# Patient Record
Sex: Male | Born: 1980 | Race: Black or African American | Hispanic: No | Marital: Single | Smoking: Current every day smoker
Health system: Southern US, Community
[De-identification: ages and names within clinical notes are randomized; demographics above are authoritative.]

## PROBLEM LIST (undated history)

## (undated) DIAGNOSIS — R0600 Dyspnea, unspecified: Secondary | ICD-10-CM

## (undated) DIAGNOSIS — R079 Chest pain, unspecified: Secondary | ICD-10-CM

## (undated) HISTORY — DX: Chest pain, unspecified: R07.9

## (undated) HISTORY — DX: Dyspnea, unspecified: R06.00

---

## 2013-01-26 ENCOUNTER — Emergency Department (HOSPITAL_COMMUNITY): Payer: Self-pay

## 2013-01-26 ENCOUNTER — Emergency Department (HOSPITAL_COMMUNITY)
Admission: EM | Admit: 2013-01-26 | Discharge: 2013-01-26 | Disposition: A | Discharge status: Home / Self Care | Payer: Self-pay | Attending: Emergency Medicine | Admitting: Emergency Medicine

## 2013-01-26 DIAGNOSIS — S0993XA Unspecified injury of face, initial encounter: Secondary | ICD-10-CM | POA: Insufficient documentation

## 2013-01-26 DIAGNOSIS — X503XXA Overexertion from repetitive movements, initial encounter: Secondary | ICD-10-CM | POA: Insufficient documentation

## 2013-01-26 DIAGNOSIS — Y929 Unspecified place or not applicable: Secondary | ICD-10-CM | POA: Insufficient documentation

## 2013-01-26 DIAGNOSIS — Y939 Activity, unspecified: Secondary | ICD-10-CM | POA: Insufficient documentation

## 2013-01-26 DIAGNOSIS — M542 Cervicalgia: Secondary | ICD-10-CM

## 2013-01-26 IMAGING — CR DG CERVICAL SPINE COMPLETE 4+V
6 series · 6 of 6 positions shown · non-contrast
Comparison: None.

CLINICAL DATA: Neck injury; pain with rotation.

CERVICAL SPINE - COMPLETE 4+ VIEW

[w c-spine lat]
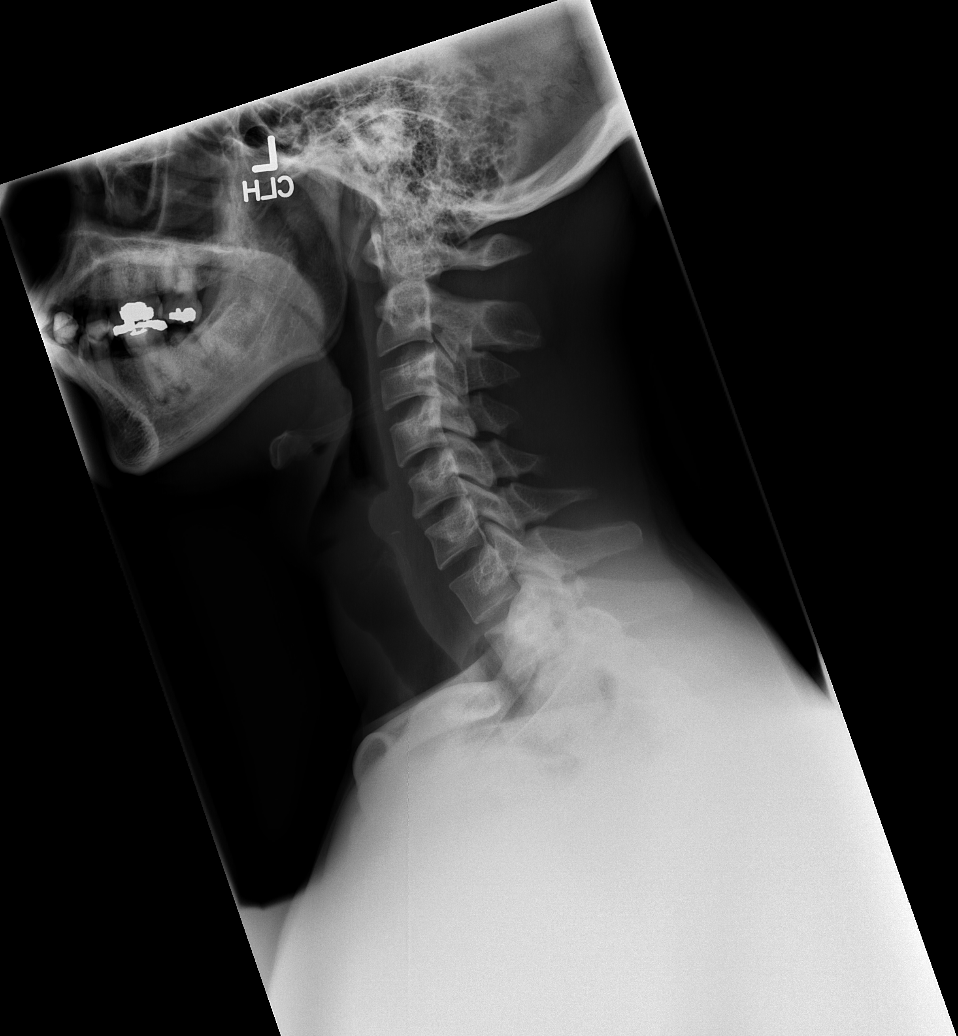

[w c-spine oblique (1 of 3)]
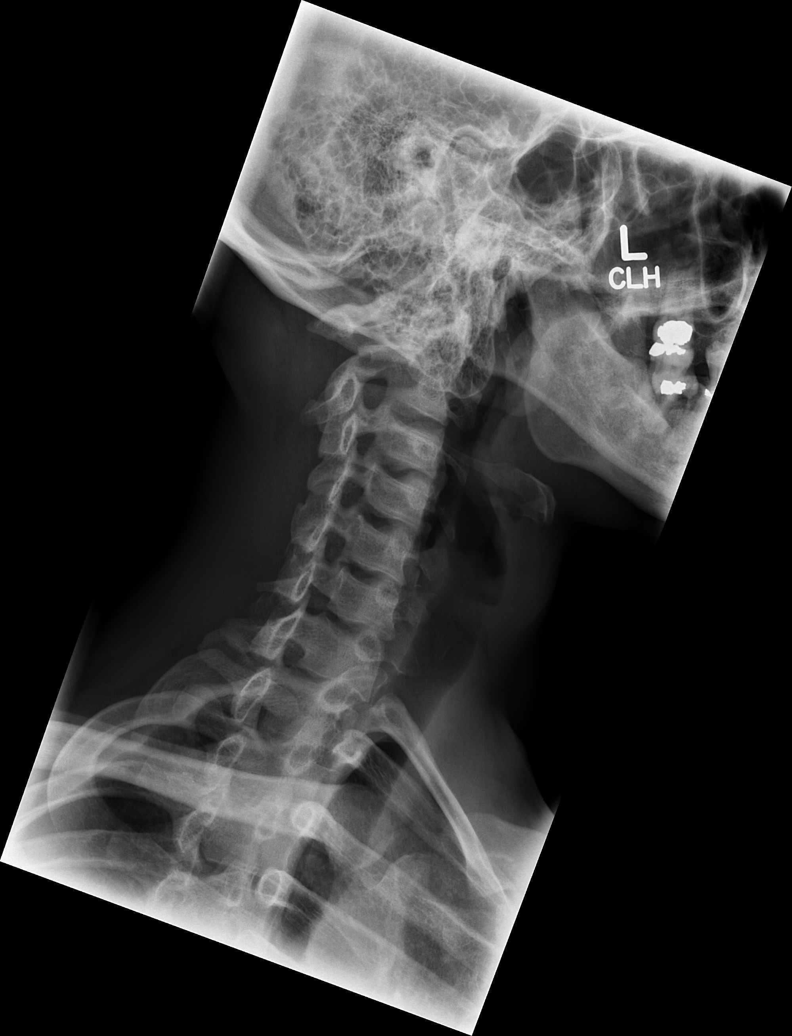

[w c-spine oblique (2 of 3)]
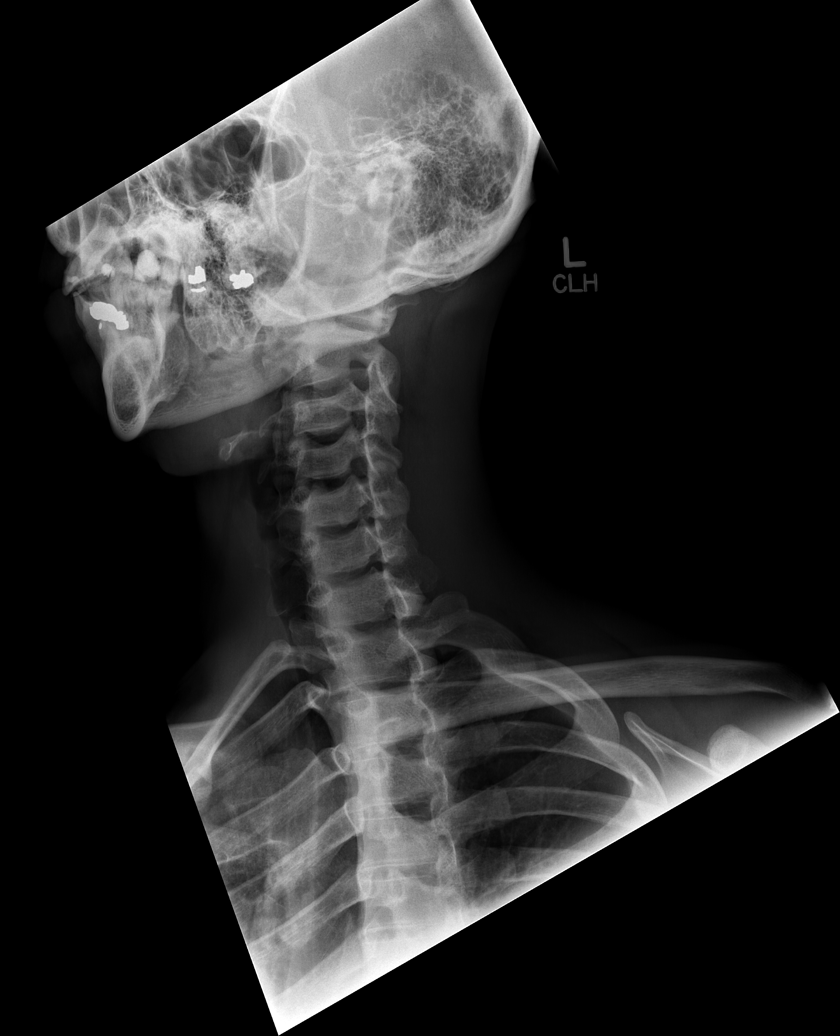

[w c-spine oblique (3 of 3)]
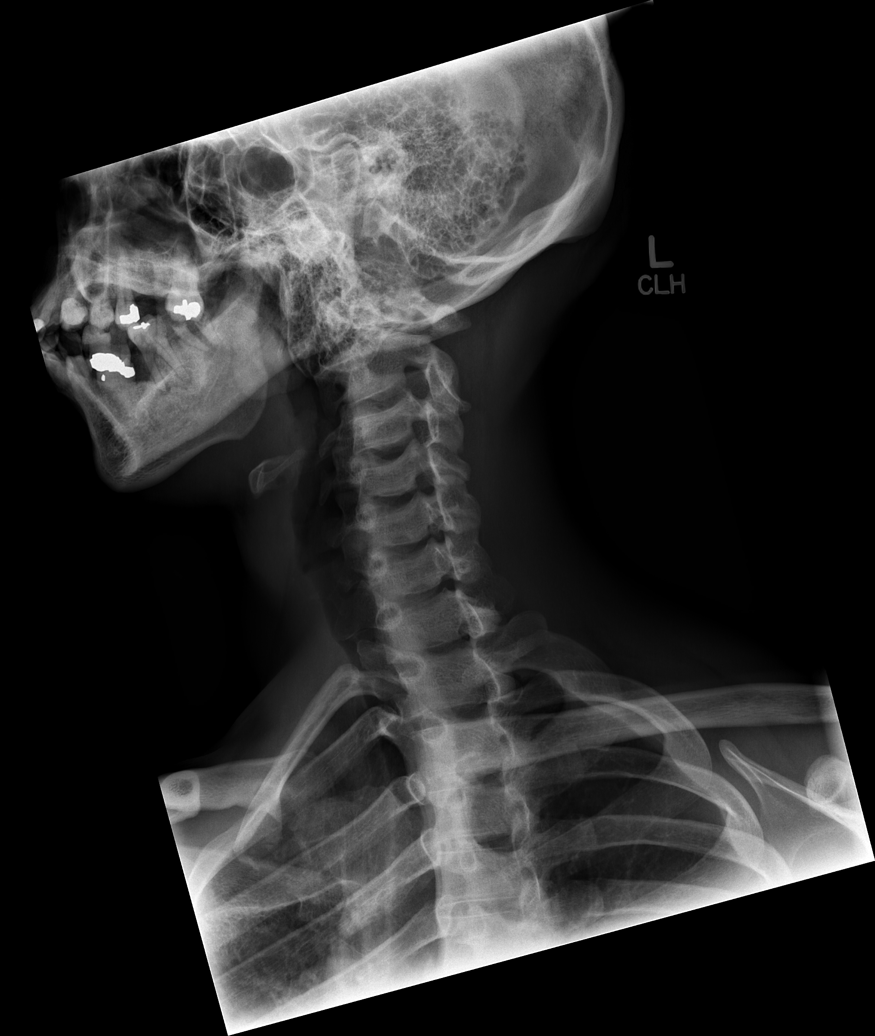

[w c-spine a.p.]
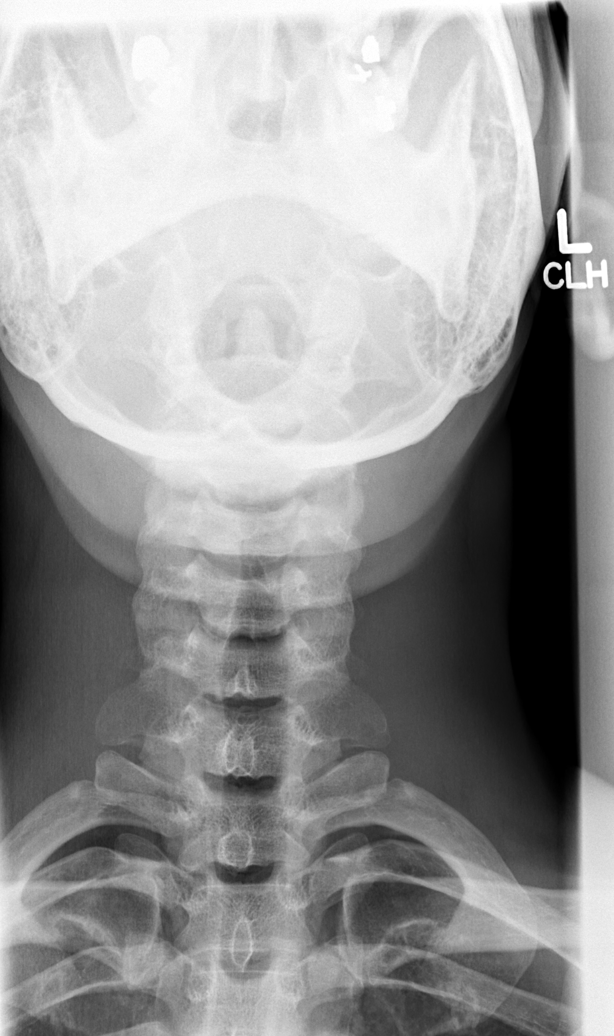

[w c-spine odontoid]
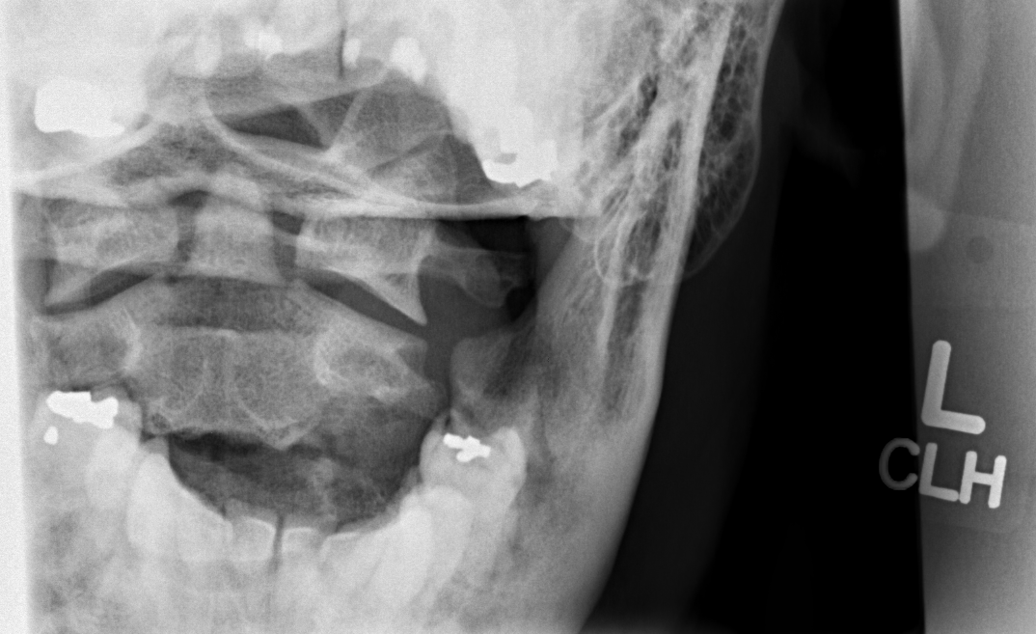

[6 of 6 positions shown; findings below may reference images not displayed]

FINDINGS: There is no evidence of fracture or subluxation.
Vertebral bodies demonstrate normal height and alignment.
Intervertebral disc spaces are preserved.  Prevertebral soft
tissues are within normal limits.  The provided odontoid view
demonstrates no significant abnormality.

The visualized lung apices are clear.
IMPRESSION: No evidence of fracture or subluxation along the cervical spine.

## 2013-01-26 MED ORDER — METHOCARBAMOL 500 MG PO TABS: 500.0000 mg | ORAL_TABLET | Freq: Two times a day (BID) | ORAL | Status: DC

## 2013-01-26 MED ORDER — HYDROCODONE-ACETAMINOPHEN 5-325 MG PO TABS: 1.0000 | ORAL_TABLET | Freq: Four times a day (QID) | ORAL | Status: DC | PRN

## 2013-01-26 MED ORDER — NAPROXEN 500 MG PO TABS: 500.0000 mg | ORAL_TABLET | Freq: Two times a day (BID) | ORAL | Status: DC

## 2013-01-26 NOTE — ED Provider Notes (Signed)
History     CSN: [id number]  Arrival date & time 01/26/13  0254   First MD Initiated Contact with Patient 01/26/13 0301      Chief Complaint  Patient presents with  . Neck Pain    (Consider location/radiation/quality/duration/timing/severity/associated sxs/prior treatment) HPI  SUBJECTIVE:  Ricardo Wright is a 32 y.o. male who complains of an injury causing neck pain 2 day(s) ago. The pain is positional with movement of neck without radiation of pain down the arms. Mechanism of injury: patient was carrying a mattress on his back when the wind blew it causing him to hyperflex the neck rapidly.  Symptoms have been constant and worsening since that time. Prior history of neck problems: no prior neck problems. There is no numbness, tingling, weakness in the arms no weakness or loss of grip strength.   No past medical history on file.  No past surgical history on file.  No family history on file.  History  Substance Use Topics  . Smoking status: Not on file  . Smokeless tobacco: Not on file  . Alcohol Use: Not on file      Review of Systems  HENT: Positive for neck pain. Negative for neck stiffness.   Neurological: Negative for dizziness, weakness, numbness and headaches.    Allergies  Review of patient's allergies indicates no known allergies.  Home Medications  No current outpatient prescriptions on file.  BP 132/88  Pulse 94  Resp 16  SpO2 98%  Physical Exam  Nursing note and vitals reviewed. Constitutional: He appears well-developed and well-nourished. No distress.  HENT:  Head: Normocephalic and atraumatic.  Eyes: Conjunctivae are normal. No scleral icterus.  Neck: Normal range of motion. Neck supple.  Cardiovascular: Normal rate, regular rhythm and normal heart sounds.   Pulmonary/Chest: Effort normal and breath sounds normal. No respiratory distress.  Abdominal: Soft. There is no tenderness.  Musculoskeletal: He exhibits no edema.  Midline cervical  tenderness. Tender to palpation of cervical paraspinal muscles.  ROM limited due to pain.strength 5/5 upper extremities.  Neurological: He is alert.  Skin: Skin is warm and dry. He is not diaphoretic.  Psychiatric: His behavior is normal.    ED Course  Procedures (including critical care time)  Labs Reviewed - No data to display No results found.   1. Neck pain, acute       MDM  Patient with msk pain  Of the neck . wil d/c with nsaid, narcotic, mm relaxer. Will d/c with PCP follow up.  paitent given  reource guide. The patient appears reasonably screened and/or stabilized for discharge and I doubt any other medical condition or other [facility] requiring further screening, evaluation, or treatment in the ED at this time prior to discharge.         Arthor Captain, PA-C 01/28/13 [phone]

## 2013-01-26 NOTE — ED Notes (Signed)
Pt states neck pain after carrying a mattress.

## 2013-01-29 NOTE — ED Provider Notes (Signed)
Medical screening examination/treatment/procedure(s) were performed by non-physician practitioner and as supervising physician I was immediately available for consultation/collaboration.   Richardean Canal, MD 01/29/13 [phone]

## 2014-04-29 ENCOUNTER — Emergency Department (INDEPENDENT_AMBULATORY_CARE_PROVIDER_SITE_OTHER)
Admission: EM | Admit: 2014-04-29 | Discharge: 2014-04-29 | Disposition: A | Discharge status: Home / Self Care | Payer: No Typology Code available for payment source | Source: Home / Self Care | Attending: Emergency Medicine | Admitting: Emergency Medicine

## 2014-04-29 ENCOUNTER — Encounter (HOSPITAL_COMMUNITY): Payer: Self-pay | Admitting: Emergency Medicine

## 2014-04-29 DIAGNOSIS — K029 Dental caries, unspecified: Secondary | ICD-10-CM

## 2014-04-29 MED ORDER — IBUPROFEN 800 MG PO TABS
800.0000 mg | ORAL_TABLET | Freq: Once | ORAL | Status: AC
  Administered 2014-04-29: 800 mg via ORAL

## 2014-04-29 MED ORDER — IBUPROFEN 800 MG PO TABS
ORAL_TABLET | ORAL | Status: AC
  Filled 2014-04-29: qty 1

## 2014-04-29 MED ORDER — PENICILLIN V POTASSIUM 500 MG PO TABS: 500.0000 mg | ORAL_TABLET | Freq: Three times a day (TID) | ORAL | Status: DC

## 2014-04-29 MED ORDER — INDOMETHACIN 25 MG PO CAPS: 25.0000 mg | ORAL_CAPSULE | Freq: Three times a day (TID) | ORAL | Status: DC | PRN

## 2014-04-29 NOTE — Discharge Instructions (Signed)
Dental Care and Dentist Visits Dental care supports good overall health. Regular dental visits can also help you avoid dental pain, bleeding, infection, and other more serious health problems in the future. It is important to keep the mouth healthy because diseases in the teeth, gums, and other oral tissues can spread to other areas of the body. Some problems, such as diabetes, heart disease, and pre-term labor have been associated with poor oral health.  See your dentist every 6 months. If you experience emergency problems such as a toothache or broken tooth, go to the dentist right away. If you see your dentist regularly, you may catch problems early. It is easier to be treated for problems in the early stages.  WHAT TO EXPECT AT A DENTIST VISIT  Your dentist will look for many common oral health problems and recommend proper treatment. At your regular dental visit, you can expect:  Gentle cleaning of the teeth and gums. This includes scraping and polishing. This helps to remove the sticky substance around the teeth and gums (plaque). Plaque forms in the mouth shortly after eating. Over time, plaque hardens on the teeth as tartar. If tartar is not removed regularly, it can cause problems. Cleaning also helps remove stains.  Periodic X-rays. These pictures of the teeth and supporting bone will help your dentist assess the health of your teeth.  Periodic fluoride treatments. Fluoride is a natural mineral shown to help strengthen teeth. Fluoride treatmentinvolves applying a fluoride gel or varnish to the teeth. It is most commonly done in children.  Examination of the mouth, tongue, jaws, teeth, and gums to look for any oral health problems, such as:  Cavities (dental caries). This is decay on the tooth caused by plaque, sugar, and acid in the mouth. It is best to catch a cavity when it is small.  Inflammation of the gums caused by plaque buildup (gingivitis).  Problems with the mouth or malformed  or misaligned teeth.  Oral cancer or other diseases of the soft tissues or jaws. KEEP YOUR TEETH AND GUMS HEALTHY For healthy teeth and gums, follow these general guidelines as well as your dentist's specific advice:  Have your teeth professionally cleaned at the dentist every 6 months.  Brush twice daily with a fluoride toothpaste.  Floss your teeth daily.  Ask your dentist if you need fluoride supplements, treatments, or fluoride toothpaste.  Eat a healthy diet. Reduce foods and drinks with added sugar.  Avoid smoking. TREATMENT FOR ORAL HEALTH PROBLEMS If you have oral health problems, treatment varies depending on the conditions present in your teeth and gums.  Your caregiver will most likely recommend good oral hygiene at each visit.  For cavities, gingivitis, or other oral health disease, your caregiver will perform a procedure to treat the problem. This is typically done at a separate appointment. Sometimes your caregiver will refer you to another dental specialist for specific tooth problems or for surgery. SEEK IMMEDIATE DENTAL CARE IF:  You have pain, bleeding, or soreness in the gum, tooth, jaw, or mouth area.  A permanent tooth becomes loose or separated from the gum socket.  You experience a blow or injury to the mouth or jaw area. Document Released: 06/16/2011 Document Revised: 12/27/2011 Document Reviewed: 06/16/2011 [facility] Patient Information 2015 [place]. This information is not intended to replace advice given to you by your health care provider. Make sure you discuss any questions you have with your health care provider.  Dental Caries  Dental caries (also called tooth  decay) is the most common oral disease. It can occur at any age, but is more common in children and young adults.  HOW DENTAL CARIES DEVELOPS  The process of decay begins when bacteria and foods (particularly sugars and starches) combine in your mouth to produce plaque. Plaque is a  substance that sticks to the hard, outer surface of a tooth (enamel). The bacteria in plaque produce acids that attack enamel. These acids may also attack the root surface of a tooth (cementum) if it is exposed. Repeated attacks dissolve these surfaces and create holes in the tooth (cavities). If left untreated, the acids destroy the other layers of the tooth.  RISK FACTORS  Frequent sipping of sugary beverages.   Frequent snacking on sugary and starchy foods, especially those that easily get stuck in the teeth.   Poor oral hygiene.   Dry mouth.   Substance abuse such as methamphetamine abuse.   Broken or poor-fitting dental restorations.   Eating disorders.   Gastroesophageal reflux disease (GERD).   Certain radiation treatments to the head and neck. SYMPTOMS In the early stages of dental caries, symptoms are seldom present. Sometimes white, chalky areas may be seen on the enamel or other tooth layers. In later stages, symptoms may include:  Pits and holes on the enamel.  Toothache after sweet, hot, or cold foods or drinks are consumed.  Pain around the tooth.  Swelling around the tooth. DIAGNOSIS  Most of the time, dental caries is detected during a regular dental checkup. A diagnosis is made after a thorough medical and dental history is taken and the surfaces of your teeth are checked for signs of dental caries. Sometimes special instruments, such as lasers, are used to check for dental caries. Dental X-ray exams may be taken so that areas not visible to the eye (such as between the contact areas of the teeth) can be checked for cavities.  TREATMENT  If dental caries is in its early stages, it may be reversed with a fluoride treatment or an application of a remineralizing agent at the dental office. Thorough brushing and flossing at home is needed to aid these treatments. If it is in its later stages, treatment depends on the location and extent of tooth destruction:    If a small area of the tooth has been destroyed, the destroyed area will be removed and cavities will be filled with a material such as gold, silver amalgam, or composite resin.   If a large area of the tooth has been destroyed, the destroyed area will be removed and a cap (crown) will be fitted over the remaining tooth structure.   If the center part of the tooth (pulp) is affected, a procedure called a root canal will be needed before a filling or crown can be placed.   If most of the tooth has been destroyed, the tooth may need to be pulled (extracted). HOME CARE INSTRUCTIONS You can prevent, stop, or reverse dental caries at home by practicing good oral hygiene. Good oral hygiene includes:  Thoroughly cleaning your teeth at least twice a day with a toothbrush and dental floss.   Using a fluoride toothpaste. A fluoride mouth rinse may also be used if recommended by your dentist or health care provider.   Restricting the amount of sugary and starchy foods and sugary liquids you consume.   Avoiding frequent snacking on these foods and sipping of these liquids.   Keeping regular visits with a dentist for checkups and cleanings. PREVENTION  Practice good oral hygiene.  Consider a dental sealant. A dental sealant is a coating material that is applied by your dentist to the pits and grooves of teeth. The sealant prevents food from being trapped in them. It may protect the teeth for several years.  Ask about fluoride supplements if you live in a community without fluorinated water or with water that has a low fluoride content. Use fluoride supplements as directed by your dentist or health care provider.  Allow fluoride varnish applications to teeth if directed by your dentist or health care provider. Document Released: 06/26/2002 Document Revised: 06/06/2013 Document Reviewed: 10/06/2012 [facility] Patient Information 2015 [place], [place]. This information is not intended to  replace advice given to you by your health care provider. Make sure you discuss any questions you have with your health care provider.  Dental Pain A tooth ache may be caused by cavities (tooth decay). Cavities expose the nerve of the tooth to air and hot or cold temperatures. It may come from an infection or abscess (also called a boil or furuncle) around your tooth. It is also often caused by dental caries (tooth decay). This causes the pain you are having. DIAGNOSIS  Your caregiver can diagnose this problem by exam. TREATMENT   If caused by an infection, it may be treated with medications which kill germs (antibiotics) and pain medications as prescribed by your caregiver. Take medications as directed.  Only take over-the-counter or prescription medicines for pain, discomfort, or fever as directed by your caregiver.  Whether the tooth ache today is caused by infection or dental disease, you should see your dentist as soon as possible for further care. SEEK MEDICAL CARE IF: The exam and treatment you received today has been provided on an emergency basis only. This is not a substitute for complete medical or dental care. If your problem worsens or new problems (symptoms) appear, and you are unable to meet with your dentist, call or return to this location. SEEK IMMEDIATE MEDICAL CARE IF:   You have a fever.  You develop redness and swelling of your face, jaw, or neck.  You are unable to open your mouth.  You have severe pain uncontrolled by pain medicine. MAKE SURE YOU:   Understand these instructions.  Will watch your condition.  Will get help right away if you are not doing well or get worse. Document Released: 10/04/2005 Document Revised: 12/27/2011 Document Reviewed: 05/22/2008 [facility] Patient Information 2015 [place], [place]. This information is not intended to replace advice given to you by your health care provider. Make sure you discuss any questions you have with your  health care provider.

## 2014-04-29 NOTE — ED Provider Notes (Signed)
CSN: [id number]     Arrival date & time 04/29/14  1254 History   First MD Initiated Contact with Patient 04/29/14 1316     Chief Complaint  Patient presents with  . Dental Pain   (Consider location/radiation/quality/duration/timing/severity/associated sxs/prior Treatment) Patient is a 33 y.o. male presenting with tooth pain. The history is provided by the patient.  Dental Pain Location:  Lower Lower teeth location:  19/LL 1st molar, 18/LL 2nd molar and 17/LL 3rd molar Quality:  Aching Severity:  Mild Onset quality:  Gradual Duration:  4 days Timing:  Constant Progression:  Unchanged Chronicity:  Recurrent Risk factors: lack of dental care and smoking     History reviewed. No pertinent past medical history. History reviewed. No pertinent past surgical history. No family history on file. History  Substance Use Topics  . Smoking status: Current Every Day Smoker -- 1.00 packs/day    Types: Cigarettes  . Smokeless tobacco: Not on file  . Alcohol Use: Yes    Review of Systems  All other systems reviewed and are negative.   Allergies  Review of patient's allergies indicates no known allergies.  Home Medications   Prior to Admission medications   Medication Sig Start Date End Date Taking? Authorizing Provider  HYDROcodone-acetaminophen (NORCO) 5-325 MG per tablet Take 1 tablet by mouth every 6 (six) hours as needed for pain. 01/26/13   Arthor CaptainAbigail Harris, PA-C  indomethacin (INDOCIN) 25 MG capsule Take 1 capsule (25 mg total) by mouth 3 (three) times daily as needed for mild pain or moderate pain. 04/29/14   Ardis RowanJennifer Lee Konnie Noffsinger, PA  methocarbamol (ROBAXIN) 500 MG tablet Take 1 tablet (500 mg total) by mouth 2 (two) times daily. 01/26/13   Arthor CaptainAbigail Harris, PA-C  naproxen (NAPROSYN) 500 MG tablet Take 1 tablet (500 mg total) by mouth 2 (two) times daily with a meal. 01/26/13   Arthor CaptainAbigail Harris, PA-C  penicillin v potassium (VEETID) 500 MG tablet Take 1 tablet (500 mg total) by mouth 3  (three) times daily. X 7 day 04/29/14   Jess BartersJennifer Lee Soledad Budreau, PA   BP 130/95  Pulse 90  Temp(Src) 99.5 F (37.5 C) (Oral)  Resp 14  SpO2 100% Physical Exam  Nursing note and vitals reviewed. Constitutional: He is oriented to person, place, and time. He appears well-developed and well-nourished. No distress.  BP rechecked manually and found to be 130/95.   HENT:  Head: Normocephalic and atraumatic.  Right Ear: External ear normal. No mastoid tenderness.  Left Ear: Hearing normal. No mastoid tenderness.  Nose: Nose normal.  Mouth/Throat: Uvula is midline, oropharynx is clear and moist and mucous membranes are normal. No oral lesions. No trismus in the jaw. Dental caries present. No uvula swelling.    Widespread dental decay with localized tenderness at left lower molars  Eyes: Conjunctivae are normal. No scleral icterus.  Neck: Normal range of motion. Neck supple.  Cardiovascular: Normal rate.   Pulmonary/Chest: Effort normal.  Lymphadenopathy:    He has no cervical adenopathy.  Neurological: He is alert and oriented to person, place, and time.  Skin: Skin is warm and dry. No rash noted. No erythema.  Psychiatric: He has a normal mood and affect. His behavior is normal.    ED Course  Procedures (including critical care time) Labs Review Labs Reviewed - No data to display  Imaging Review No results found.   MDM   1. Dental cavities    Provided patient with printed information for 6 local low cost dental clinics  along with upcoming [facility] free dental clinic Aug. 28- 29, 2015. Also rechecked BP manually and found it to be 130/95.    Jess Barters [place], [place] 04/29/14 [phone]

## 2014-04-29 NOTE — ED Notes (Signed)
Pt c/o left bottom side dental pain x5 days Has not dentist Trying OTC oral gel w/no relief Alert w/no signs of acute distress.

## 2014-04-30 NOTE — ED Provider Notes (Signed)
Medical screening examination/treatment/procedure(s) were performed by non-physician practitioner and as supervising physician I was immediately available for consultation/collaboration.  Leslee Homeavid Thessaly Mccullers, M.D.  Reuben Likesavid C Ancelmo Hunt, MD 04/30/14 [phone]

## 2018-05-01 ENCOUNTER — Encounter (HOSPITAL_COMMUNITY): Payer: Self-pay | Admitting: Emergency Medicine

## 2018-05-01 ENCOUNTER — Emergency Department (HOSPITAL_COMMUNITY)
Admission: EM | Admit: 2018-05-01 | Discharge: 2018-05-02 | Disposition: A | Discharge status: Home / Self Care | Payer: No Typology Code available for payment source | Attending: Emergency Medicine | Admitting: Emergency Medicine

## 2018-05-01 DIAGNOSIS — Y9389 Activity, other specified: Secondary | ICD-10-CM | POA: Insufficient documentation

## 2018-05-01 DIAGNOSIS — S61215A Laceration without foreign body of left ring finger without damage to nail, initial encounter: Secondary | ICD-10-CM | POA: Insufficient documentation

## 2018-05-01 DIAGNOSIS — F17210 Nicotine dependence, cigarettes, uncomplicated: Secondary | ICD-10-CM | POA: Insufficient documentation

## 2018-05-01 DIAGNOSIS — Z23 Encounter for immunization: Secondary | ICD-10-CM | POA: Insufficient documentation

## 2018-05-01 DIAGNOSIS — Y999 Unspecified external cause status: Secondary | ICD-10-CM | POA: Insufficient documentation

## 2018-05-01 DIAGNOSIS — W228XXA Striking against or struck by other objects, initial encounter: Secondary | ICD-10-CM | POA: Insufficient documentation

## 2018-05-01 DIAGNOSIS — Y929 Unspecified place or not applicable: Secondary | ICD-10-CM | POA: Insufficient documentation

## 2018-05-01 MED ORDER — LIDOCAINE-EPINEPHRINE (PF) 2 %-1:200000 IJ SOLN
20.0000 mL | Freq: Once | INTRAMUSCULAR | Status: AC
  Administered 2018-05-01: 20 mL
  Filled 2018-05-01: qty 20

## 2018-05-01 MED ORDER — TETANUS-DIPHTH-ACELL PERTUSSIS 5-2.5-18.5 LF-MCG/0.5 IM SUSP
0.5000 mL | Freq: Once | INTRAMUSCULAR | Status: AC
  Administered 2018-05-01: 0.5 mL via INTRAMUSCULAR
  Filled 2018-05-01: qty 0.5

## 2018-05-01 MED ORDER — CEPHALEXIN 500 MG PO CAPS: 500.0000 mg | ORAL_CAPSULE | Freq: Two times a day (BID) | ORAL | 0 refills | Status: AC

## 2018-05-01 NOTE — ED Triage Notes (Signed)
Pt cut finger on cabinet, pt does not know when last tetanus shot was. Denies pain, bleeding controlled

## 2018-05-01 NOTE — Discharge Instructions (Addendum)
1. Medications: Tylenol or ibuprofen for pain, antibiotics 2. Treatment: ice for swelling, keep wound clean with warm soap and water and keep bandage dry 3. Follow Up: Please return in 10 days to have your stitches removed or sooner if you have concerns. Return to the emergency department for increased redness, drainage of pus from the wound   WOUND CARE  Remove bandage and wash wound gently with mild soap and warm water daily. Reapply a new bandage after cleaning wound  Do not apply any ointments or creams to the wound while stitches are in place, as this may cause delayed healing. Return if you experience any of the following signs of infection: Swelling, redness, pus drainage, streaking, fever >101.0 F  Return if you experience excessive bleeding that does not stop after 15-20 minutes of constant, firm pressure.

## 2018-05-02 NOTE — ED Provider Notes (Signed)
[facility] EMERGENCY DEPARTMENT Provider Note   CSN: [id number] Arrival date & time: 05/01/18  2135     History   Chief Complaint Chief Complaint  Patient presents with  . Extremity Laceration    HPI Ricardo Wright is a 37 y.o. male presenting for evaluation of finger laceration.  Patient states he was moving a filing cabinet when he cut the side of his left ring finger.  This happened around noon today.  He wrapped it up with some cloth, but when he went to unwrap it several hours later, he noticed that he was still having some bleeding and noticed the meat of the fingers seem to be hanging out.  He then came to the emergency room.  He does not know when his last tetanus shot was, things was more than 10 years ago.  He is not on blood thinners.  He has no other medical problems, does not take medications daily.  He has not taken anything for pain.  He states his finger does not hurt very much.  He denies numbness or tingling.  He denies injury elsewhere.  HPI  History reviewed. No pertinent past medical history.  There are no active problems to display for this patient.   History reviewed. No pertinent surgical history.      Home Medications    Prior to Admission medications   Medication Sig Start Date End Date Taking? Authorizing Provider  cephALEXin (KEFLEX) 500 MG capsule Take 1 capsule (500 mg total) by mouth 2 (two) times daily for 5 days. 05/01/18 05/06/18  Steen Bisig, PA-C  HYDROcodone-acetaminophen (NORCO) 5-325 MG per tablet Take 1 tablet by mouth every 6 (six) hours as needed for pain. 01/26/13   Arthor Captain, PA-C  indomethacin (INDOCIN) 25 MG capsule Take 1 capsule (25 mg total) by mouth 3 (three) times daily as needed for mild pain or moderate pain. 04/29/14   Presson, Mathis Fare, PA  methocarbamol (ROBAXIN) 500 MG tablet Take 1 tablet (500 mg total) by mouth 2 (two) times daily. 01/26/13   Arthor Captain, PA-C  naproxen (NAPROSYN)  500 MG tablet Take 1 tablet (500 mg total) by mouth 2 (two) times daily with a meal. 01/26/13   Arthor Captain, PA-C  penicillin v potassium (VEETID) 500 MG tablet Take 1 tablet (500 mg total) by mouth 3 (three) times daily. X 7 day 04/29/14   Presson, Mathis Fare, PA    Family History No family history on file.  Social History Social History   Tobacco Use  . Smoking status: Current Every Day Smoker    Packs/day: 1.00    Types: Cigarettes  . Smokeless tobacco: Never Used  Substance Use Topics  . Alcohol use: Yes  . Drug use: Yes    Types: Marijuana     Allergies   Patient has no known allergies.   Review of Systems Review of Systems  Skin: Positive for wound.  Neurological: Negative for numbness.  Hematological: Does not bruise/bleed easily.     Physical Exam Updated Vital Signs BP 124/78   Pulse 87   Temp 98.6 F (37 C) (Oral)   Ht 6\' 2"  (1.88 m)   Wt 104.3 kg (230 lb)   SpO2 94%   BMI 29.53 kg/m   Physical Exam  Constitutional: He is oriented to person, place, and time. He appears well-developed and well-nourished. No distress.  HENT:  Head: Normocephalic and atraumatic.  Eyes: EOM are normal.  Neck: Normal range of motion.  Pulmonary/Chest: Effort normal.  Abdominal: He exhibits no distension.  Musculoskeletal: Normal range of motion. He exhibits no edema or tenderness.  Full active range of motion of the ring finger without difficulty.  Sensation intact.  Cap refill intact.  Strength against resistance intact.  Neurological: He is alert and oriented to person, place, and time. No sensory deficit.  Skin: Skin is warm. Capillary refill takes less than 2 seconds. No rash noted.  Laceration of the left ring finger without active bleeding.  Laceration is V-shaped overlying the ulnar aspect of the PIP.  Depth approximately 1 to 2 mm.  Length approximately 1 cm.  No injury noted elsewhere.  Psychiatric: He has a normal mood and affect.  Nursing note and  vitals reviewed.    ED Treatments / Results  Labs (all labs ordered are listed, but only abnormal results are displayed) Labs Reviewed - No data to display  EKG None  Radiology No results found.  Procedures .Marland Kitchen.Laceration Repair Date/Time: 05/02/2018 12:33 AM Performed by: Alveria Apleyaccavale, Algie Cales, PA-C Authorized by: Alveria Apleyaccavale, Chavela Justiniano, PA-C   Consent:    Consent obtained:  Verbal   Consent given by:  Patient   Risks discussed:  Pain and infection Anesthesia (see MAR for exact dosages):    Anesthesia method:  Nerve block   Block location:  Left ring finger digital block   Block needle gauge:  25 G   Block anesthetic:  Lidocaine 2% WITH epi   Block injection procedure:  Anatomic landmarks identified, introduced needle, negative aspiration for blood and incremental injection   Block outcome:  Anesthesia achieved Laceration details:    Location:  Finger   Finger location:  L ring finger   Length (cm):  1   Depth (mm):  2 Repair type:    Repair type:  Simple Pre-procedure details:    Preparation:  Patient was prepped and draped in usual sterile fashion Exploration:    Wound exploration: wound explored through full range of motion and entire depth of wound probed and visualized     Wound extent: no foreign bodies/material noted, no muscle damage noted, no nerve damage noted, no tendon damage noted and no vascular damage noted   Treatment:    Area cleansed with:  Saline and Betadine   Amount of cleaning:  Extensive   Irrigation method:  Syringe Skin repair:    Repair method:  Sutures   Suture size:  4-0   Suture material:  Prolene   Suture technique:  Simple interrupted   Number of sutures:  3 Approximation:    Approximation:  Close Post-procedure details:    Dressing:  Bulky dressing   Patient tolerance of procedure:  Tolerated well, no immediate complications   (including critical care time)  Medications Ordered in ED Medications  lidocaine-EPINEPHrine (XYLOCAINE  W/EPI) 2 %-1:200000 (PF) injection 20 mL (20 mLs Infiltration Given 05/01/18 2256)  Tdap (BOOSTRIX) injection 0.5 mL (0.5 mLs Intramuscular Given 05/01/18 2359)     Initial Impression / Assessment and Plan / ED Course  I have reviewed the triage vital signs and the nursing notes.  Pertinent labs & imaging results that were available during my care of the patient were reviewed by me and considered in my medical decision making (see chart for details).     Patient presenting for evaluation of finger laceration.  Physical exam reassuring, patient is neurovascularly intact.  It is not very deep, doubt underlying fracture.  However, laceration is overlying the joint.  Laceration has been present for  approximately 9 hours, will soak in Betadine and extensively cleaned prior to suturing.  Laceration repaired and aftercare instructions given.  Will start patient on Keflex for prophylaxis due to prolonged exposure time.  Bulky dressing to help prevent bending.  Strict return precautions for signs of infection.  Tetanus updated.  At this time, patient proceed for discharge.  Return precautions given.  Patient states he understands and agrees to plan.  Final Clinical Impressions(s) / ED Diagnoses   Final diagnoses:  Laceration of left ring finger without foreign body without damage to nail, initial encounter    ED Discharge Orders        Ordered    cephALEXin (KEFLEX) 500 MG capsule  2 times daily     05/01/18 2351       Maija Biggers, PA-C 05/02/18 0038    Linwood Dibbles, MD 05/02/18 1549

## 2019-03-22 IMAGING — CR DG SHOULDER 2+V*R*
3 series · 3 of 3 positions shown · non-contrast
Comparison: None.

CLINICAL DATA: Posttraumatic shoulder pain.

EXAM:
RIGHT SHOULDER - 2+ VIEW

[w shoulder external right]
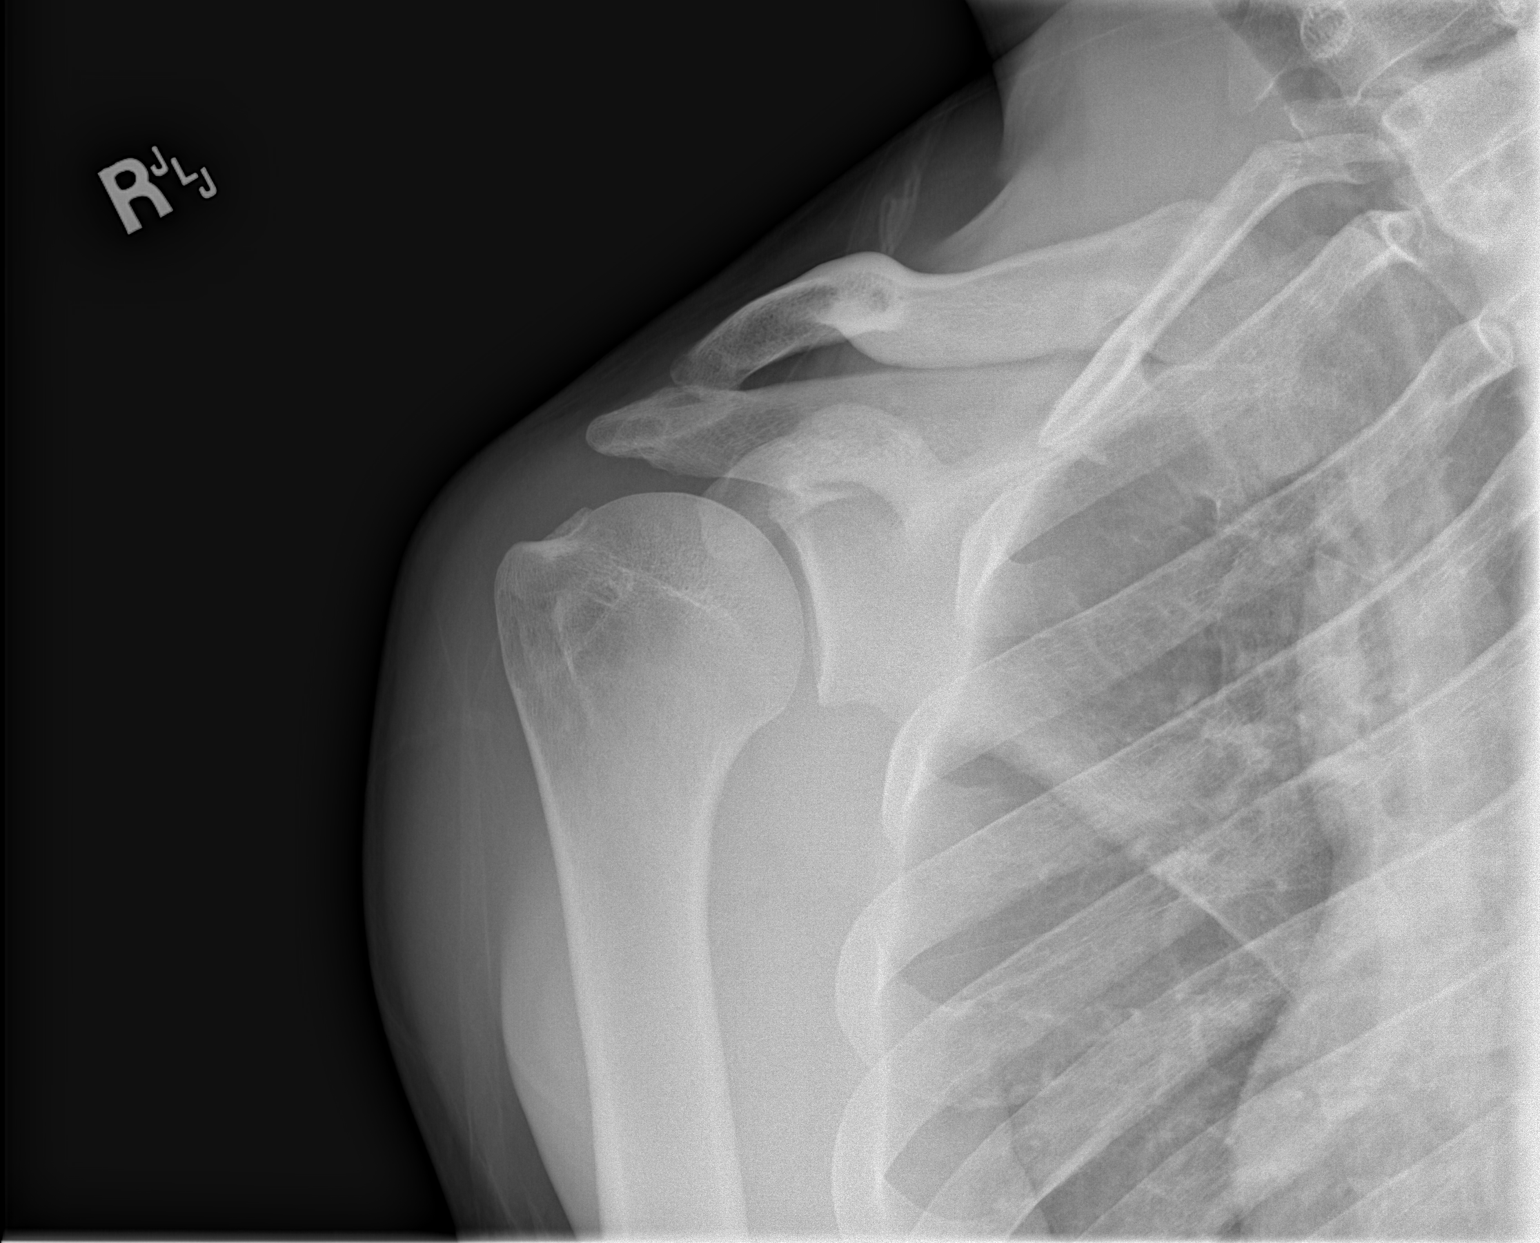

[w shoulder y-view right]
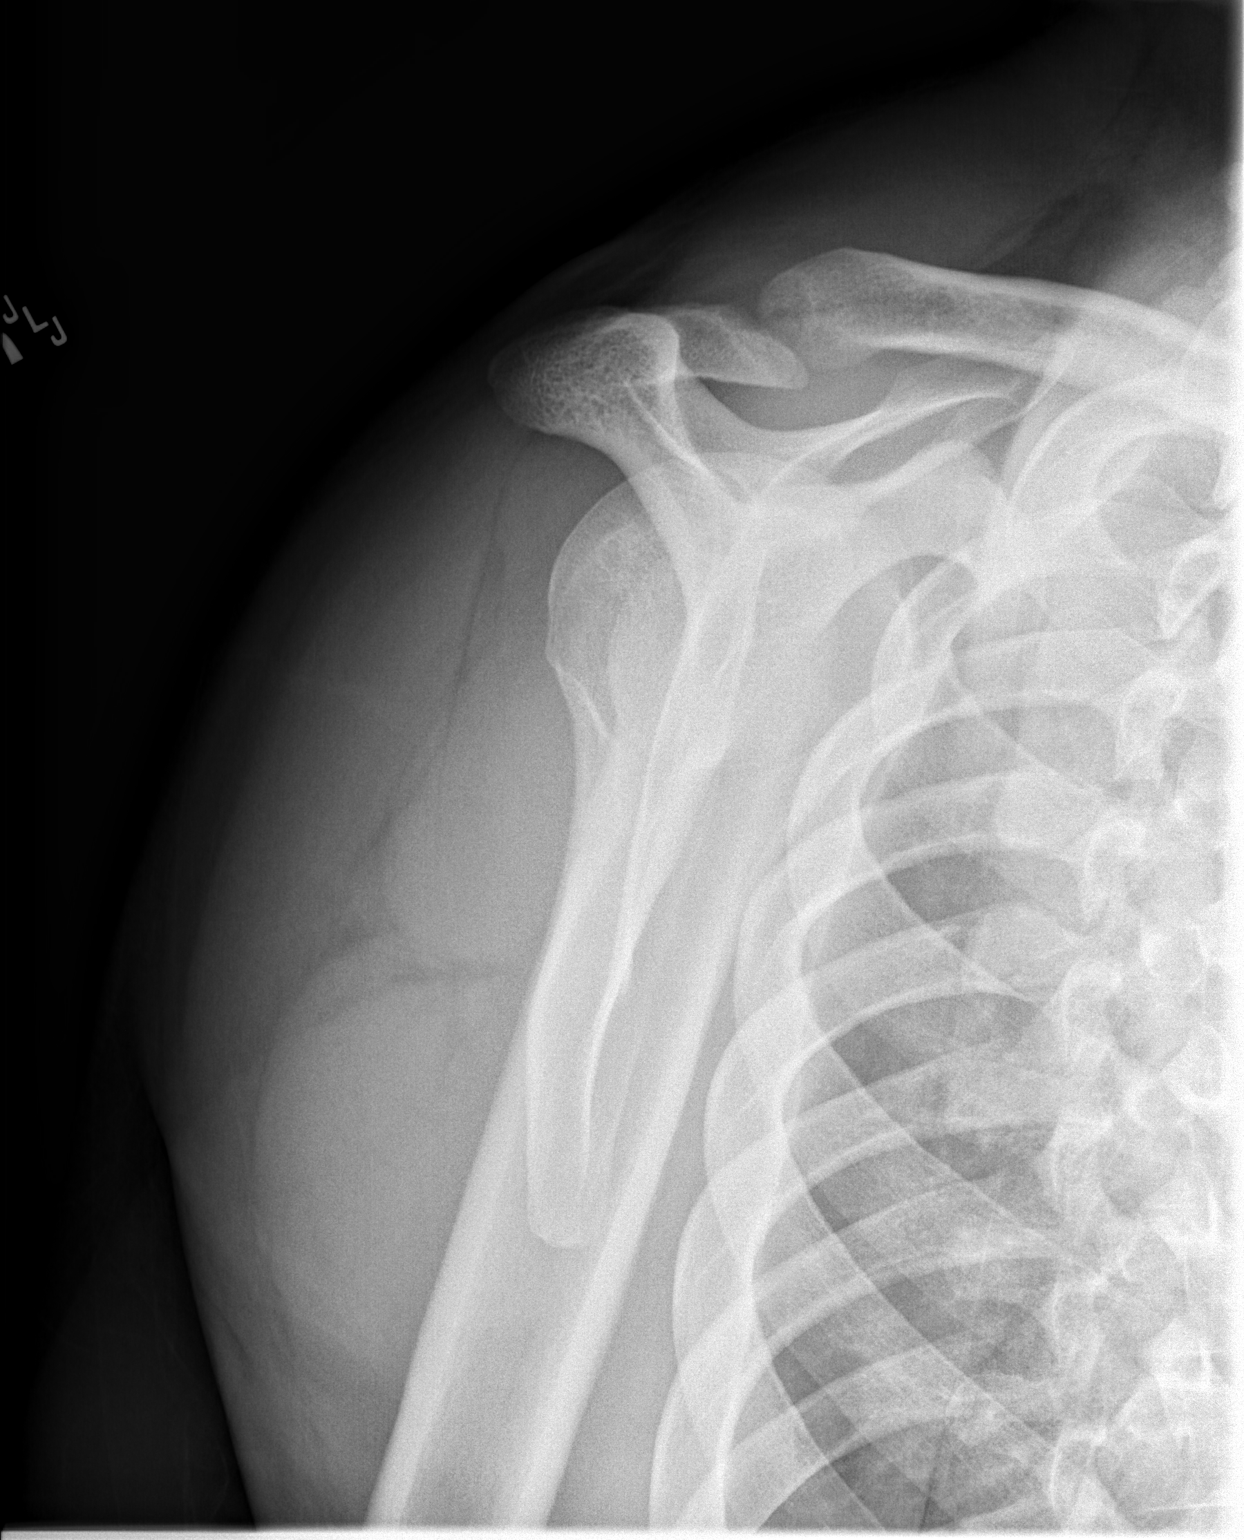

[x shoulder axillary right]
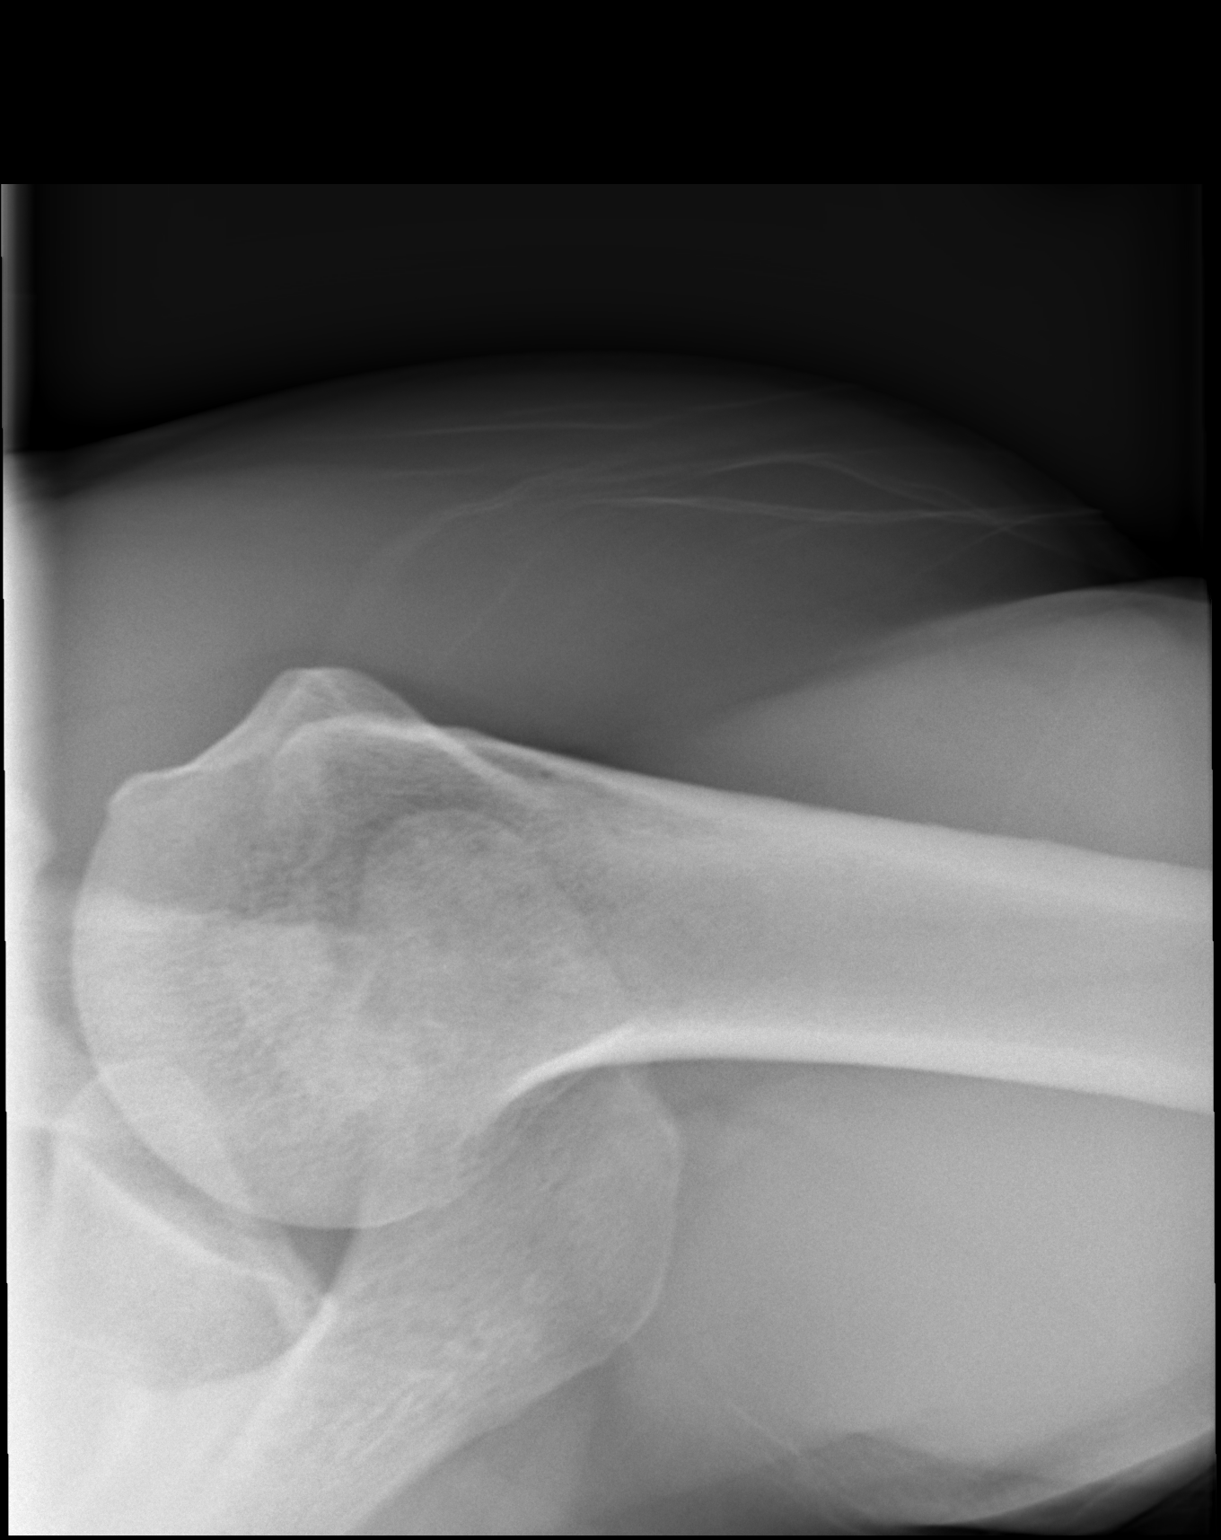

[3 of 3 positions shown; findings below may reference images not displayed]

FINDINGS: There is no evidence of fracture or dislocation. There is no
evidence of arthropathy or other focal bone abnormality. Soft
tissues are unremarkable.
IMPRESSION: Negative.

## 2019-07-23 ENCOUNTER — Emergency Department (HOSPITAL_COMMUNITY): Payer: Self-pay

## 2019-07-23 ENCOUNTER — Emergency Department (HOSPITAL_COMMUNITY)
Admission: EM | Admit: 2019-07-23 | Discharge: 2019-07-23 | Disposition: A | Discharge status: Home / Self Care | Payer: Self-pay | Attending: Emergency Medicine | Admitting: Emergency Medicine

## 2019-07-23 ENCOUNTER — Other Ambulatory Visit: Payer: Self-pay

## 2019-07-23 ENCOUNTER — Encounter (HOSPITAL_COMMUNITY): Payer: Self-pay | Admitting: Emergency Medicine

## 2019-07-23 DIAGNOSIS — F17210 Nicotine dependence, cigarettes, uncomplicated: Secondary | ICD-10-CM | POA: Insufficient documentation

## 2019-07-23 DIAGNOSIS — M25511 Pain in right shoulder: Secondary | ICD-10-CM | POA: Insufficient documentation

## 2019-07-23 DIAGNOSIS — R0789 Other chest pain: Secondary | ICD-10-CM | POA: Insufficient documentation

## 2019-07-23 DIAGNOSIS — F1210 Cannabis abuse, uncomplicated: Secondary | ICD-10-CM | POA: Insufficient documentation

## 2019-07-23 IMAGING — CR DG CLAVICLE*R*
2 series · 2 of 2 positions shown · non-contrast
Comparison: None.

CLINICAL DATA: Posttraumatic right shoulder pain

EXAM:
RIGHT CLAVICLE - 2+ VIEWS

[w clavicle ap right]
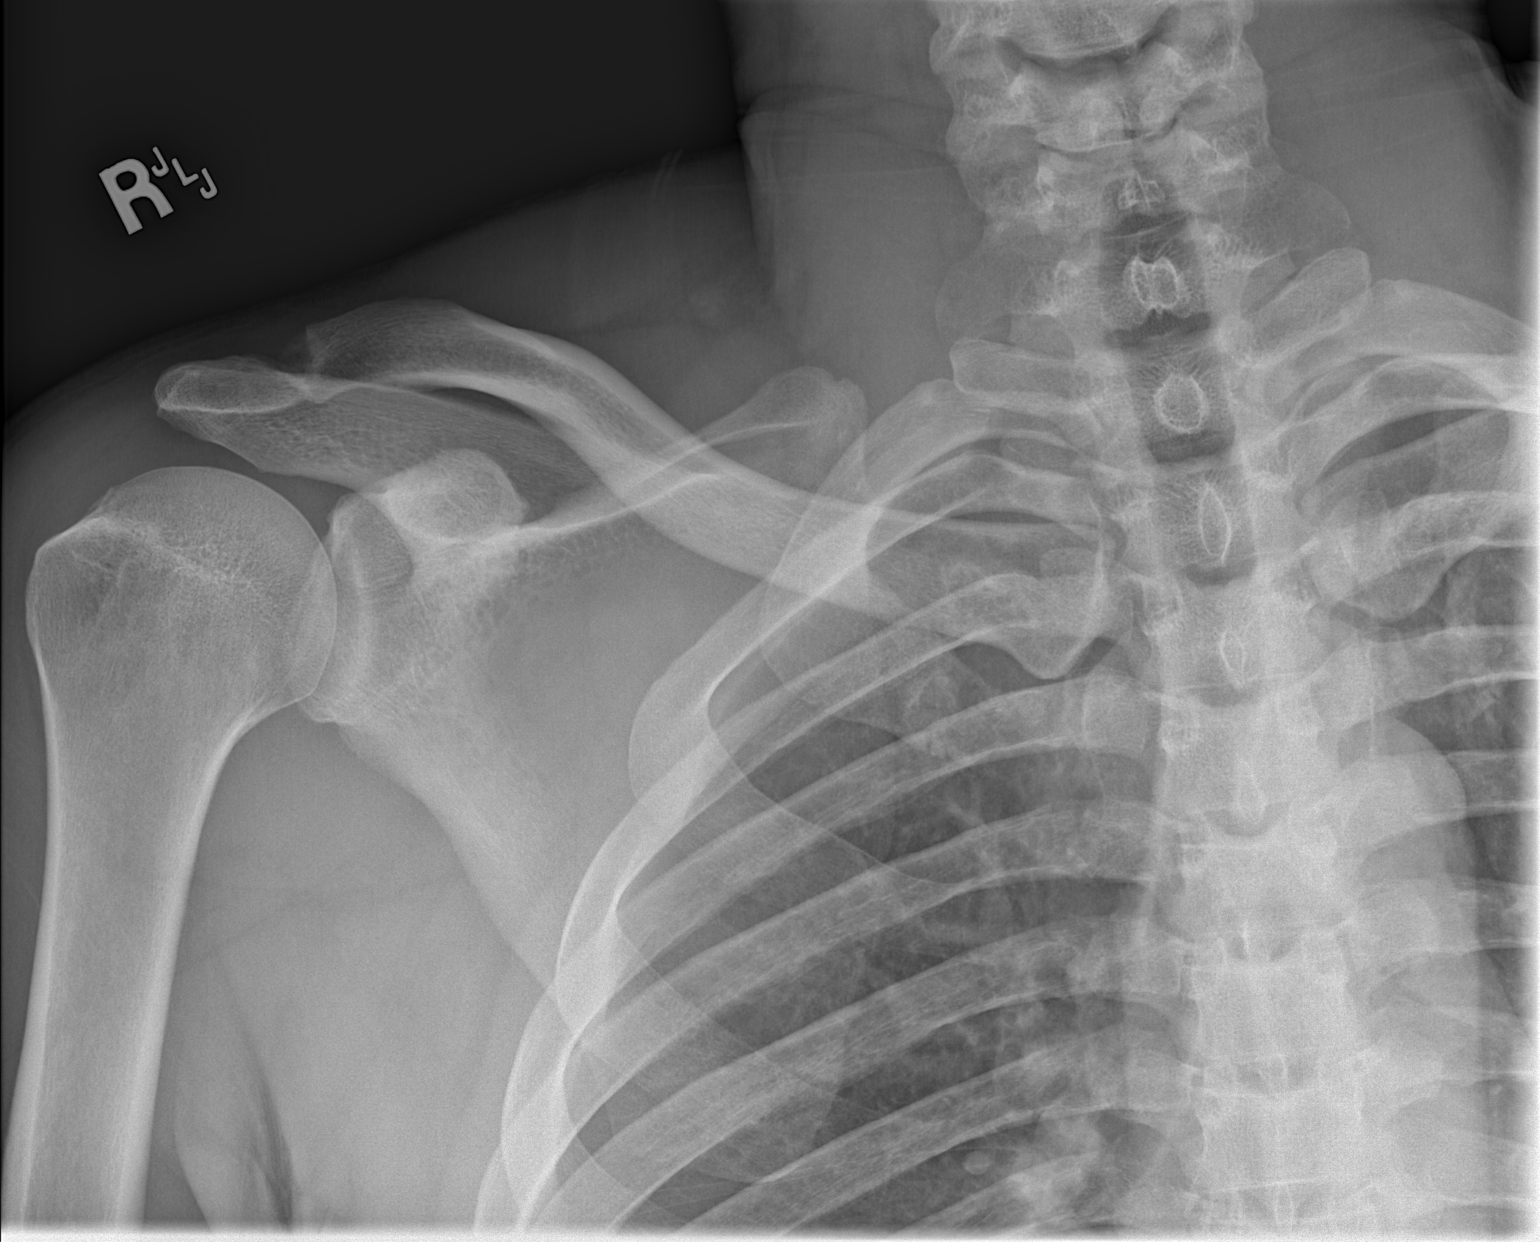

[w clavicle tangential right]
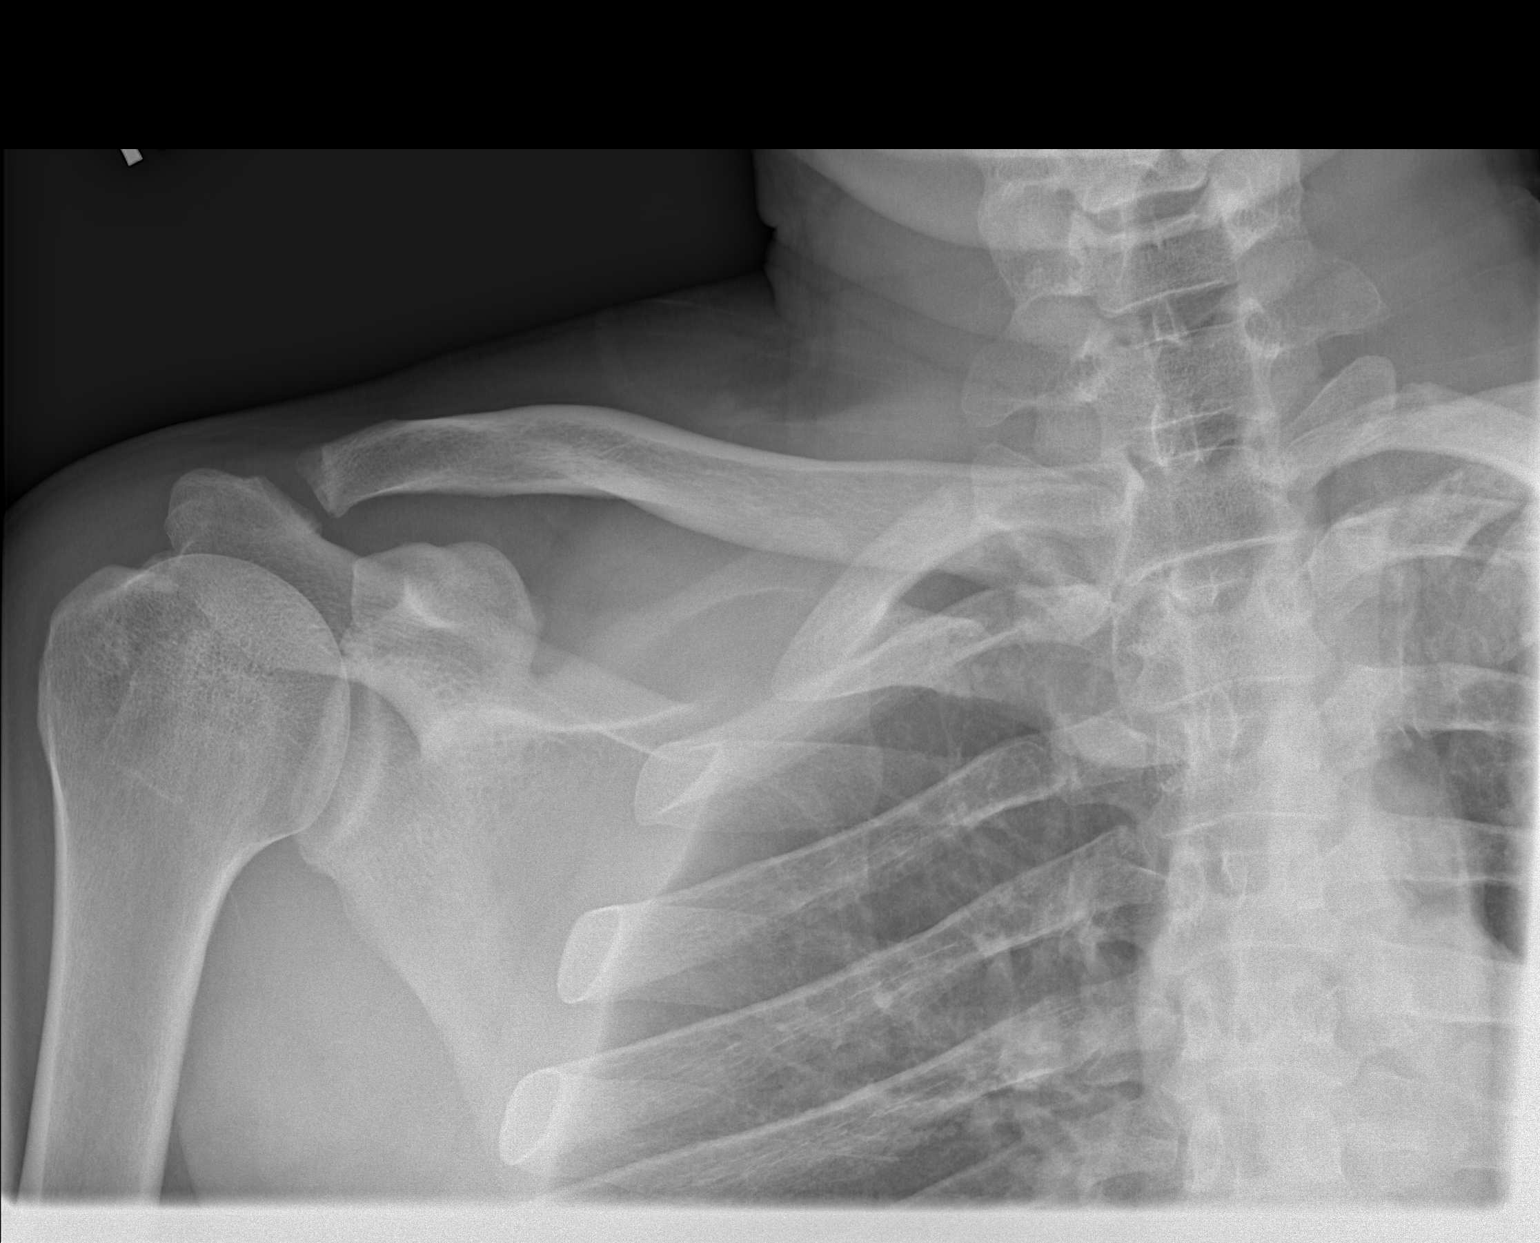

[2 of 2 positions shown; findings below may reference images not displayed]

FINDINGS: No evidence of fracture or malalignment. Mild subchondral
irregularity at the lateral clavicle. No degenerative spurring
IMPRESSION: 1. Negative for fracture or malalignment.
2. Mild subchondral erosion at the lateral clavicle, usually
posttraumatic if there is no history of renal disease or signs of
infection.

## 2019-07-23 MED ORDER — LIDOCAINE 5 % EX PTCH
1.0000 | MEDICATED_PATCH | CUTANEOUS | Status: DC
  Administered 2019-07-23: 1 via TRANSDERMAL
  Filled 2019-07-23: qty 1

## 2019-07-23 MED ORDER — METHOCARBAMOL 500 MG PO TABS: 500.0000 mg | ORAL_TABLET | Freq: Two times a day (BID) | ORAL | 0 refills | Status: DC | PRN

## 2019-07-23 MED ORDER — HYDROMORPHONE HCL 1 MG/ML IJ SOLN
0.5000 mg | Freq: Once | INTRAMUSCULAR | Status: AC
  Administered 2019-07-23: 0.5 mg via INTRAMUSCULAR
  Filled 2019-07-23: qty 1

## 2019-07-23 NOTE — ED Notes (Signed)
Pt transported to XR.  

## 2019-07-23 NOTE — ED Provider Notes (Signed)
[facility] DEPT Provider Note   CSN: [id number] Arrival date & time: 07/23/19  0415     History   Chief Complaint Chief Complaint  Patient presents with  . Shoulder Pain    HPI Ricardo Wright is a 38 y.o. male presenting for evaluation of right-sided shoulder pain.  Patient states about 1 hour prior to arrival he went to open a door using his shoulder, and thus hit his right shoulder against a door.  He reports acute onset pain in the shoulder and anterior upper chest.  Pain is constant, worse with movement.  He has not taken anything for it including Tylenol ibuprofen.  He denies pain on movement side.  He denies pain in his elbow or wrist.  He denies previous injury of the shoulder.  He does not have an orthopedic doctor.     HPI  History reviewed. No pertinent past medical history.  There are no active problems to display for this patient.   History reviewed. No pertinent surgical history.      Home Medications    Prior to Admission medications   Medication Sig Start Date End Date Taking? Authorizing Provider  methocarbamol (ROBAXIN) 500 MG tablet Take 1 tablet (500 mg total) by mouth 2 (two) times daily as needed for muscle spasms. 07/23/19   Boston Catarino, PA-C    Family History No family history on file.  Social History Social History   Tobacco Use  . Smoking status: Current Every Day Smoker    Packs/day: 1.00    Types: Cigarettes  . Smokeless tobacco: Never Used  Substance Use Topics  . Alcohol use: Yes  . Drug use: Yes    Types: Marijuana     Allergies   Patient has no known allergies.   Review of Systems Review of Systems  Musculoskeletal: Positive for arthralgias.  Neurological: Negative for numbness.     Physical Exam Updated Vital Signs BP (!) 136/92   Pulse 80   Temp 98.2 F (36.8 C) (Oral)   Resp 17   Ht 6\' 2"  (1.88 m)   Wt 99.8 kg   SpO2 99%   BMI 28.25 kg/m   Physical Exam Vitals signs  and nursing note reviewed.  Constitutional:      General: He is not in acute distress.    Appearance: He is well-developed.  HENT:     Head: Normocephalic and atraumatic.  Neck:     Musculoskeletal: Normal range of motion.  Pulmonary:     Effort: Pulmonary effort is normal.  Abdominal:     General: There is no distension.  Musculoskeletal:        General: Tenderness present.     Comments: TTP of R shoulder joint. Severe tenderness of R clavicle, no obvious deformity. Radial pulses intact  Skin:    General: Skin is warm.     Capillary Refill: Capillary refill takes less than 2 seconds.     Findings: No rash.  Neurological:     Mental Status: He is alert and oriented to person, place, and time.      ED Treatments / Results  Labs (all labs ordered are listed, but only abnormal results are displayed) Labs Reviewed - No data to display  EKG None  Radiology Dg Clavicle Right  Result Date: 07/23/2019 CLINICAL DATA:  Posttraumatic right shoulder pain EXAM: RIGHT CLAVICLE - 2+ VIEWS COMPARISON:  None. FINDINGS: No evidence of fracture or malalignment. Mild subchondral irregularity at the lateral clavicle. No degenerative  spurring IMPRESSION: 1. Negative for fracture or malalignment. 2. Mild subchondral erosion at the lateral clavicle, usually posttraumatic if there is no history of renal disease or signs of infection. Electronically Signed   By: Marnee Spring M.D.   On: 07/23/2019 05:45   Dg Shoulder Right  Result Date: 07/23/2019 CLINICAL DATA:  Posttraumatic shoulder pain. EXAM: RIGHT SHOULDER - 2+ VIEW COMPARISON:  None. FINDINGS: There is no evidence of fracture or dislocation. There is no evidence of arthropathy or other focal bone abnormality. Soft tissues are unremarkable. IMPRESSION: Negative. Electronically Signed   By: Marnee Spring M.D.   On: 07/23/2019 05:43    Procedures Procedures (including critical care time)  Medications Ordered in ED Medications  lidocaine  (LIDODERM) 5 % 1 patch (1 patch Transdermal Patch Applied 07/23/19 0626)  HYDROmorphone (DILAUDID) injection 0.5 mg (0.5 mg Intramuscular Given 07/23/19 0457)     Initial Impression / Assessment and Plan / ED Course  I have reviewed the triage vital signs and the nursing notes.  Pertinent labs & imaging results that were available during my care of the patient were reviewed by me and considered in my medical decision making (see chart for details).        Pt presenting for evaluation of R shoulder pain. Physical exam reassuring, appears nontoxic. Neurovascularly intact. Will order xrays. Consider fx vs dislocation vs MSK cause (such as rotator cuff injury).   xrays viewed and interpreted by me, no fx or dislocation. Will tx with sling, likely rotator cuff injury. Will have pt f/u with ortho. At this time, pt appears safe for d/c. Return precautions given. Pt states he understands and agrees to plan.   Final Clinical Impressions(s) / ED Diagnoses   Final diagnoses:  Acute pain of right shoulder    ED Discharge Orders         Ordered    methocarbamol (ROBAXIN) 500 MG tablet  2 times daily PRN     07/23/19 0558           Alveria Apley, PA-C 07/23/19 0645    Palumbo, April, MD 07/23/19 [phone]

## 2019-07-23 NOTE — ED Triage Notes (Signed)
Pt complaint of right shoulder pain "unable to move it" after opening a door. Event an hour ago.

## 2019-07-23 NOTE — Discharge Instructions (Addendum)
Wear the sling to help with pain control. Take ibuprofen 3 times a day with meals.  Do not take other anti-inflammatories at the same time (Advil, Motrin, naproxen, Aleve). You may supplement with Tylenol if you need further pain control. Use ice packs, 20 minutes at a time, for pain and swelling. Use the muscle relaxer as needed for pain.  Have caution, this may make you tired or groggy.  Do not drive or operate heavy machinery while taking this medicine. Call the orthopedic doctor listed below to set up a follow-up appointment for further evaluation management of your shoulder pain. Return to the emergency room with any new, worsening, concerning symptoms.

## 2019-07-23 NOTE — ED Notes (Signed)
Pt and visitor verbalized discharge instructions and follow up care. Alert and ambulatory. Sling applied. No IV. Visitor driving home.

## 2019-10-31 ENCOUNTER — Other Ambulatory Visit: Payer: Self-pay

## 2019-10-31 ENCOUNTER — Ambulatory Visit (HOSPITAL_COMMUNITY)
Admission: EM | Admit: 2019-10-31 | Discharge: 2019-10-31 | Disposition: A | Discharge status: Home / Self Care | Payer: HRSA Program | Attending: Urgent Care | Admitting: Urgent Care

## 2019-10-31 ENCOUNTER — Encounter (HOSPITAL_COMMUNITY): Payer: Self-pay

## 2019-10-31 DIAGNOSIS — Z20822 Contact with and (suspected) exposure to covid-19: Secondary | ICD-10-CM | POA: Diagnosis not present

## 2019-10-31 DIAGNOSIS — R519 Headache, unspecified: Secondary | ICD-10-CM | POA: Diagnosis not present

## 2019-10-31 DIAGNOSIS — K529 Noninfective gastroenteritis and colitis, unspecified: Secondary | ICD-10-CM

## 2019-10-31 DIAGNOSIS — R112 Nausea with vomiting, unspecified: Secondary | ICD-10-CM | POA: Insufficient documentation

## 2019-10-31 DIAGNOSIS — F17210 Nicotine dependence, cigarettes, uncomplicated: Secondary | ICD-10-CM | POA: Insufficient documentation

## 2019-10-31 DIAGNOSIS — R109 Unspecified abdominal pain: Secondary | ICD-10-CM | POA: Diagnosis not present

## 2019-10-31 MED ORDER — ONDANSETRON 8 MG PO TBDP: 8.0000 mg | ORAL_TABLET | Freq: Three times a day (TID) | ORAL | 0 refills | Status: DC | PRN

## 2019-10-31 NOTE — ED Triage Notes (Signed)
Pt states last night he had abd pain, n/v, diaphoresis, HA, chills sx improved today,but states loss of appetite today. Denies sore throat, cough, SOB, general aches or dysuria sx. Denies abd pain at present.

## 2019-10-31 NOTE — Discharge Instructions (Addendum)
Make sure you push fluids drinking mostly water but mix it with Gatorade.  Try to eat light meals including soups, broths and soft foods, fruits.  You may use Zofran for your nausea and vomiting once every 8 hours.  Imodium can help with diarrhea but use this carefully limiting it to 1-2 times per day only if you are having a lot of diarrhea. 

## 2019-10-31 NOTE — ED Provider Notes (Signed)
[facility]   MRN: [id number] DOB: 07/05/1981  Subjective:   Monnie Anspach is a 39 y.o. male presenting for 1 day history of n/v, mild to moderate abdominal pain following his vomiting.  He also had a generalized headache and chills.  States that it was difficult for him to get restless night from his symptoms but today feels a lot better.  Has not tried medications for relief.  He has a daughter in his wife is pregnant at home and would like to make sure he does have COVID-19.  He is not currently taking any medications and has no known food or drug allergies.  Denies past medical and surgical history.   Family History  Problem Relation Age of Onset  . Diabetes Mother   . Cancer Mother   . Healthy Father     Social History   Tobacco Use  . Smoking status: Current Every Day Smoker    Packs/day: 1.00    Types: Cigarettes  . Smokeless tobacco: Never Used  Substance Use Topics  . Alcohol use: Yes  . Drug use: Yes    Types: Marijuana    ROS Denies fever, throat pain, sinus congestion chest pain, shortness of breath, cough.  Denies any known direct Covid 19 contacts.  Objective:   Vitals: Pulse 61   Temp 98.6 F (37 C) (Oral)   Resp 18   SpO2 100%   Physical Exam Constitutional:      General: He is not in acute distress.    Appearance: Normal appearance. He is well-developed. He is not ill-appearing, toxic-appearing or diaphoretic.  HENT:     Head: Normocephalic and atraumatic.     Right Ear: External ear normal.     Left Ear: External ear normal.     Nose: Nose normal.     Mouth/Throat:     Mouth: Mucous membranes are moist.     Pharynx: Oropharynx is clear.  Eyes:     General: No scleral icterus.    Extraocular Movements: Extraocular movements intact.     Pupils: Pupils are equal, round, and reactive to light.  Cardiovascular:     Rate and Rhythm: Normal rate and regular rhythm.     Heart sounds: Normal heart sounds. No murmur. No friction rub. No  gallop.   Pulmonary:     Effort: Pulmonary effort is normal. No respiratory distress.     Breath sounds: Normal breath sounds. No stridor. No wheezing, rhonchi or rales.  Abdominal:     General: Bowel sounds are normal. There is no distension.     Palpations: Abdomen is soft. There is no mass.     Tenderness: There is no abdominal tenderness. There is no guarding or rebound.  Skin:    General: Skin is warm and dry.  Neurological:     Mental Status: He is alert and oriented to person, place, and time.  Psychiatric:        Mood and Affect: Mood normal.        Behavior: Behavior normal.        Thought Content: Thought content normal.      Assessment and Plan :   1. Gastroenteritis   2. Nausea and vomiting, intractability of vomiting not specified, unspecified vomiting type     COVID-19 testing pending. Will manage for gastroenteritis with supportive care.  Recommended patient hydrate well, eat light meals and maintain electrolytes.  Will use Zofran and Imodium for nausea, vomiting and diarrhea. Counseled patient on potential for  adverse effects with medications prescribed/recommended today, ER and return-to-clinic precautions discussed, patient verbalized understanding.    Wallis Bamberg, PA-C 10/31/19 1820

## 2019-11-02 LAB — NOVEL CORONAVIRUS, NAA (HOSP ORDER, SEND-OUT TO REF LAB; TAT 18-24 HRS): SARS-CoV-2, NAA: NOT DETECTED

## 2020-11-18 ENCOUNTER — Ambulatory Visit
Admission: EM | Admit: 2020-11-18 | Discharge: 2020-11-18 | Disposition: A | Discharge status: Home / Self Care | Payer: Self-pay | Attending: Emergency Medicine | Admitting: Emergency Medicine

## 2020-11-18 ENCOUNTER — Other Ambulatory Visit: Payer: Self-pay

## 2020-11-18 ENCOUNTER — Ambulatory Visit: Payer: Self-pay

## 2020-11-18 DIAGNOSIS — H1032 Unspecified acute conjunctivitis, left eye: Secondary | ICD-10-CM

## 2020-11-18 MED ORDER — POLYMYXIN B-TRIMETHOPRIM 10000-0.1 UNIT/ML-% OP SOLN: 1.0000 [drp] | Freq: Four times a day (QID) | OPHTHALMIC | 0 refills | Status: AC

## 2020-11-18 NOTE — ED Triage Notes (Signed)
Pt c/o lt eye watering, red, and swelling since Sunday. States his son has pink year.

## 2020-11-18 NOTE — ED Provider Notes (Signed)
[name]    CSN: [id number] Arrival date & time: 11/18/20  1312      History   Chief Complaint Chief Complaint  Patient presents with  . appt 1 - eye pain    HPI Ricardo Wright is a 40 y.o. male presenting today for evaluation of left eye redness and drainage.  Reports beginning Sunday and over the past 2 days he has developed increased redness and swelling to his left eye.  Reports crusting in the morning with continued thick discharge throughout the day.  Has had foreign body sensation.  Denies any injury or trauma.  Denies any known foreign body in eye.  Denies use of contacts.  Denies vision changes.  Denies photophobia.  Reports son with similar symptoms 2 weeks ago.  HPI  History reviewed. No pertinent past medical history.  There are no problems to display for this patient.   History reviewed. No pertinent surgical history.     Home Medications    Prior to Admission medications   Medication Sig Start Date End Date Taking? Authorizing Provider  trimethoprim-polymyxin b (POLYTRIM) ophthalmic solution Place 1-2 drops into the left eye every 6 (six) hours for 7 days. 11/18/20 11/25/20 Yes Eren Puebla, Junius Creamer, PA-C    Family History Family History  Problem Relation Age of Onset  . Diabetes Mother   . Cancer Mother   . Healthy Father     Social History Social History   Tobacco Use  . Smoking status: Current Every Day Smoker    Packs/day: 1.00    Types: Cigarettes  . Smokeless tobacco: Never Used  Vaping Use  . Vaping Use: Never used  Substance Use Topics  . Alcohol use: Not Currently  . Drug use: Not Currently    Types: Marijuana     Allergies   Patient has no known allergies.   Review of Systems Review of Systems  Constitutional: Negative for activity change, appetite change, chills, fatigue and fever.  HENT: Negative for congestion, ear pain, rhinorrhea, sinus pressure, sore throat and trouble swallowing.   Eyes: Positive for pain,  discharge, redness and itching. Negative for photophobia and visual disturbance.  Respiratory: Negative for cough, chest tightness and shortness of breath.   Cardiovascular: Negative for chest pain.  Gastrointestinal: Negative for abdominal pain, diarrhea, nausea and vomiting.  Musculoskeletal: Negative for myalgias.  Skin: Negative for rash.  Neurological: Negative for dizziness, light-headedness and headaches.     Physical Exam Triage Vital Signs ED Triage Vitals  Enc Vitals Group     BP 11/18/20 1326 (!) 143/97     Pulse Rate 11/18/20 1326 79     Resp 11/18/20 1326 18     Temp 11/18/20 1326 98.2 F (36.8 C)     Temp Source 11/18/20 1326 Oral     SpO2 11/18/20 1326 98 %     Weight --      Height --      Head Circumference --      Peak Flow --      Pain Score 11/18/20 1327 3     Pain Loc --      Pain Edu? --      Excl. in GC? --    No data found.  Updated Vital Signs BP (!) 143/97 (BP Location: Left Arm)   Pulse 79   Temp 98.2 F (36.8 C) (Oral)   Resp 18   SpO2 98%   Visual Acuity Right Eye Distance: 20/20 Left Eye Distance: 20/30 Bilateral  Distance: 20/20  Right Eye Near:   Left Eye Near:    Bilateral Near:     Physical Exam Vitals and nursing note reviewed.  Constitutional:      Appearance: He is well-developed and well-nourished.     Comments: No acute distress  HENT:     Head: Normocephalic and atraumatic.     Nose: Nose normal.  Eyes:     Comments: Left eye with conjunctival erythema, anterior chamber clear, no photophobia with exam, limbus clear, no fluorescein uptake on exam  Mild upper and lower lid swelling with slight hyperpigmentation, no erythema  Cardiovascular:     Rate and Rhythm: Normal rate.  Pulmonary:     Effort: Pulmonary effort is normal. No respiratory distress.  Abdominal:     General: There is no distension.  Musculoskeletal:        General: Normal range of motion.     Cervical back: Neck supple.  Skin:    General: Skin  is warm and dry.  Neurological:     Mental Status: He is alert and oriented to person, place, and time.  Psychiatric:        Mood and Affect: Mood and affect normal.      UC Treatments / Results  Labs (all labs ordered are listed, but only abnormal results are displayed) Labs Reviewed - No data to display  EKG   Radiology No results found.  Procedures Procedures (including critical care time)  Medications Ordered in UC Medications - No data to display  Initial Impression / Assessment and Plan / UC Course  I have reviewed the triage vital signs and the nursing notes.  Pertinent labs & imaging results that were available during my care of the patient were reviewed by me and considered in my medical decision making (see chart for details).     Treating for bacterial conjunctivitis with Polytrim, no sign of preseptal/septal cellulitis at this time, cool compresses, no sign of abrasion or ulcer.  Continue to monitor,Discussed strict return precautions. Patient verbalized understanding and is agreeable with plan.  Final Clinical Impressions(s) / UC Diagnoses   Final diagnoses:  Acute bacterial conjunctivitis of left eye     Discharge Instructions     Bacterial Conjunctivitis  - Use Polytrim eye drops (2 drops 4 times a day for 5-7 days)  - Have Good Hand Hygiene  - Use Cold Compresses       ED Prescriptions    Medication Sig Dispense Auth. Provider   trimethoprim-polymyxin b (POLYTRIM) ophthalmic solution Place 1-2 drops into the left eye every 6 (six) hours for 7 days. 10 mL April Carlyon, [place] C, PA-C     PDMP not reviewed this encounter.   Lew Dawes, [place] 11/18/20 1436

## 2020-11-18 NOTE — Discharge Instructions (Signed)
Bacterial Conjunctivitis  - Use Polytrim eye drops (2 drops 4 times a day for 5-7 days)  - Have Good Hand Hygiene  - Use Cold Compresses

## 2020-12-09 ENCOUNTER — Encounter (HOSPITAL_COMMUNITY): Payer: Self-pay

## 2020-12-09 ENCOUNTER — Ambulatory Visit (HOSPITAL_COMMUNITY): Payer: Self-pay

## 2020-12-09 ENCOUNTER — Ambulatory Visit (HOSPITAL_COMMUNITY)
Admission: RE | Admit: 2020-12-09 | Discharge: 2020-12-09 | Disposition: A | Discharge status: Home / Self Care | Payer: Self-pay | Source: Ambulatory Visit | Attending: Family Medicine | Admitting: Family Medicine

## 2020-12-09 ENCOUNTER — Other Ambulatory Visit: Payer: Self-pay

## 2020-12-09 VITALS — BP 129/92 | HR 80 | Temp 98.0°F | Resp 18

## 2020-12-09 DIAGNOSIS — K649 Unspecified hemorrhoids: Secondary | ICD-10-CM

## 2020-12-09 DIAGNOSIS — Z800 Family history of malignant neoplasm of digestive organs: Secondary | ICD-10-CM

## 2020-12-09 DIAGNOSIS — K625 Hemorrhage of anus and rectum: Secondary | ICD-10-CM

## 2020-12-09 MED ORDER — HYDROCORTISONE (PERIANAL) 2.5 % EX CREA: 1.0000 "application " | TOPICAL_CREAM | Freq: Two times a day (BID) | CUTANEOUS | 0 refills | Status: DC | PRN

## 2020-12-09 NOTE — ED Triage Notes (Signed)
Pt presents with rectal bleeding only when using the bathroom X 1 week.  Pt states has family Hx of rectal cancer

## 2020-12-09 NOTE — ED Notes (Signed)
This nurse at bedside with provider for rectal exam.

## 2020-12-09 NOTE — ED Provider Notes (Signed)
[facility]    CSN: [id number] Arrival date & time: 12/09/20  1645      History   Chief Complaint Chief Complaint  Patient presents with  . APPOINTMENT : Rectal Bleeding    HPI Ricardo Wright is a 40 y.o. male.   Here today with several episodes of bright red rectal bleeding with BMs the past week. States it does not occur every time he has a BM and is not associated with pain, itching, constipation. Has had this occasionally in the past but never for this long. Not trying anything OTC for sxs, and no fever, unintentional weight loss, abdominal pain, N/V/D. Notes mother had rectal cancer and that he is concerned because of this.      History reviewed. No pertinent past medical history.  There are no problems to display for this patient.   History reviewed. No pertinent surgical history.     Home Medications    Prior to Admission medications   Medication Sig Start Date End Date Taking? Authorizing Provider  hydrocortisone (ANUSOL-HC) 2.5 % rectal cream Place 1 application rectally 2 (two) times daily as needed for hemorrhoids or anal itching. 12/09/20  Yes Particia Nearing, PA-C    Family History Family History  Problem Relation Age of Onset  . Diabetes Mother   . Cancer Mother   . Healthy Father     Social History Social History   Tobacco Use  . Smoking status: Current Every Day Smoker    Packs/day: 1.00    Types: Cigarettes  . Smokeless tobacco: Never Used  Vaping Use  . Vaping Use: Never used  Substance Use Topics  . Alcohol use: Not Currently  . Drug use: Not Currently    Types: Marijuana     Allergies   Patient has no known allergies.   Review of Systems Review of Systems PER HPI    Physical Exam Triage Vital Signs ED Triage Vitals  Enc Vitals Group     BP 12/09/20 1724 (!) 129/92     Pulse Rate 12/09/20 1724 80     Resp 12/09/20 1724 18     Temp 12/09/20 1724 98 F (36.7 C)     Temp Source 12/09/20 1724 Oral      SpO2 12/09/20 1724 100 %     Weight --      Height --      Head Circumference --      Peak Flow --      Pain Score 12/09/20 1721 0     Pain Loc --      Pain Edu? --      Excl. in GC? --    No data found.  Updated Vital Signs BP (!) 129/92 (BP Location: Left Arm)   Pulse 80   Temp 98 F (36.7 C) (Oral)   Resp 18   SpO2 100%   Visual Acuity Right Eye Distance:   Left Eye Distance:   Bilateral Distance:    Right Eye Near:   Left Eye Near:    Bilateral Near:     Physical Exam Vitals and nursing note reviewed. Exam conducted with a chaperone present.  Constitutional:      Appearance: Normal appearance.  HENT:     Head: Atraumatic.  Eyes:     Extraocular Movements: Extraocular movements intact.     Conjunctiva/sclera: Conjunctivae normal.  Cardiovascular:     Rate and Rhythm: Normal rate and regular rhythm.  Pulmonary:     Effort: Pulmonary  effort is normal.     Breath sounds: Normal breath sounds.  Abdominal:     General: Bowel sounds are normal. There is no distension.     Palpations: Abdomen is soft.     Tenderness: There is no abdominal tenderness. There is no right CVA tenderness, left CVA tenderness or guarding.  Genitourinary:    Comments: One inflamed external hemorrhoid visible, nontender Inflamed internal hemorrhoids palpable on digital rectal exam, no active bleeding or evidence of fissures, abscess, ulceration on exam Musculoskeletal:        General: Normal range of motion.     Cervical back: Normal range of motion and neck supple.  Skin:    General: Skin is warm and dry.  Neurological:     General: No focal deficit present.     Mental Status: He is oriented to person, place, and time.  Psychiatric:        Mood and Affect: Mood normal.        Thought Content: Thought content normal.        Judgment: Judgment normal.     UC Treatments / Results  Labs (all labs ordered are listed, but only abnormal results are displayed) Labs Reviewed - No data  to display  EKG   Radiology No results found.  Procedures Procedures (including critical care time)  Medications Ordered in UC Medications - No data to display  Initial Impression / Assessment and Plan / UC Course  I have reviewed the triage vital signs and the nursing notes.  Pertinent labs & imaging results that were available during my care of the patient were reviewed by me and considered in my medical decision making (see chart for details).     Likely his bleeding is related to inflamed hemorrhoids, will treat with anusol cream, good bowel regimen, and good lifting techniques at work and avoidance of straining with BMs. Do suggest GI f/u for further evaluation given fhx.   Final Clinical Impressions(s) / UC Diagnoses   Final diagnoses:  Bright red blood per rectum  Hemorrhoids, unspecified hemorrhoid type  Family history of GI tract cancer   Discharge Instructions   None    ED Prescriptions    Medication Sig Dispense Auth. Provider   hydrocortisone (ANUSOL-HC) 2.5 % rectal cream Place 1 application rectally 2 (two) times daily as needed for hemorrhoids or anal itching. 60 g Particia Nearing, [place]     PDMP not reviewed this encounter.   Particia Nearing, [place] 12/09/20 [phone]

## 2020-12-30 ENCOUNTER — Ambulatory Visit: Payer: Self-pay | Admitting: Medical

## 2021-02-07 ENCOUNTER — Encounter (HOSPITAL_COMMUNITY): Payer: Self-pay

## 2021-02-07 ENCOUNTER — Other Ambulatory Visit: Payer: Self-pay

## 2021-02-07 ENCOUNTER — Emergency Department (HOSPITAL_COMMUNITY)
Admission: EM | Admit: 2021-02-07 | Discharge: 2021-02-07 | Disposition: A | Discharge status: Home / Self Care | Payer: Self-pay | Attending: Emergency Medicine | Admitting: Emergency Medicine

## 2021-02-07 ENCOUNTER — Emergency Department (HOSPITAL_COMMUNITY): Payer: Self-pay

## 2021-02-07 DIAGNOSIS — R0789 Other chest pain: Secondary | ICD-10-CM | POA: Insufficient documentation

## 2021-02-07 DIAGNOSIS — F17210 Nicotine dependence, cigarettes, uncomplicated: Secondary | ICD-10-CM | POA: Insufficient documentation

## 2021-02-07 DIAGNOSIS — R079 Chest pain, unspecified: Secondary | ICD-10-CM

## 2021-02-07 LAB — CBC WITH DIFFERENTIAL/PLATELET
Abs Immature Granulocytes: 0.01 10*3/uL (ref 0.00–0.07)
Basophils Absolute: 0 10*3/uL (ref 0.0–0.1)
Basophils Relative: 1 %
Eosinophils Absolute: 0.4 10*3/uL (ref 0.0–0.5)
Eosinophils Relative: 5 %
HCT: 41.4 % (ref 39.0–52.0)
Hemoglobin: 13.6 g/dL (ref 13.0–17.0)
Immature Granulocytes: 0 %
Lymphocytes Relative: 37 %
Lymphs Abs: 2.7 10*3/uL (ref 0.7–4.0)
MCH: 32.9 pg (ref 26.0–34.0)
MCHC: 32.9 g/dL (ref 30.0–36.0)
MCV: 100 fL (ref 80.0–100.0)
Monocytes Absolute: 0.8 10*3/uL (ref 0.1–1.0)
Monocytes Relative: 12 %
Neutro Abs: 3.2 10*3/uL (ref 1.7–7.7)
Neutrophils Relative %: 45 %
Platelets: 263 10*3/uL (ref 150–400)
RBC: 4.14 MIL/uL — ABNORMAL LOW (ref 4.22–5.81)
RDW: 11.6 % (ref 11.5–15.5)
WBC: 7.2 10*3/uL (ref 4.0–10.5)
nRBC: 0 % (ref 0.0–0.2)

## 2021-02-07 LAB — BASIC METABOLIC PANEL
Anion gap: 6 (ref 5–15)
BUN: 11 mg/dL (ref 6–20)
CO2: 28 mmol/L (ref 22–32)
Calcium: 9.3 mg/dL (ref 8.9–10.3)
Chloride: 106 mmol/L (ref 98–111)
Creatinine, Ser: 1.36 mg/dL — ABNORMAL HIGH (ref 0.61–1.24)
GFR, Estimated: >60 mL/min (ref >60)
Glucose, Bld: 105 mg/dL — ABNORMAL HIGH (ref 70–99)
Potassium: 3.8 mmol/L (ref 3.5–5.1)
Sodium: 140 mmol/L (ref 135–145)

## 2021-02-07 LAB — TROPONIN I (HIGH SENSITIVITY)
Troponin I (High Sensitivity): 4 ng/L (ref <18)
Troponin I (High Sensitivity): 3 ng/L (ref <18)

## 2021-02-07 IMAGING — DX DG CHEST 2V
2 series · 2 of 2 positions shown · non-contrast
Comparison: None.

CLINICAL DATA: Transient chest pain.

EXAM:
CHEST - 2 VIEW

[w chest pa]
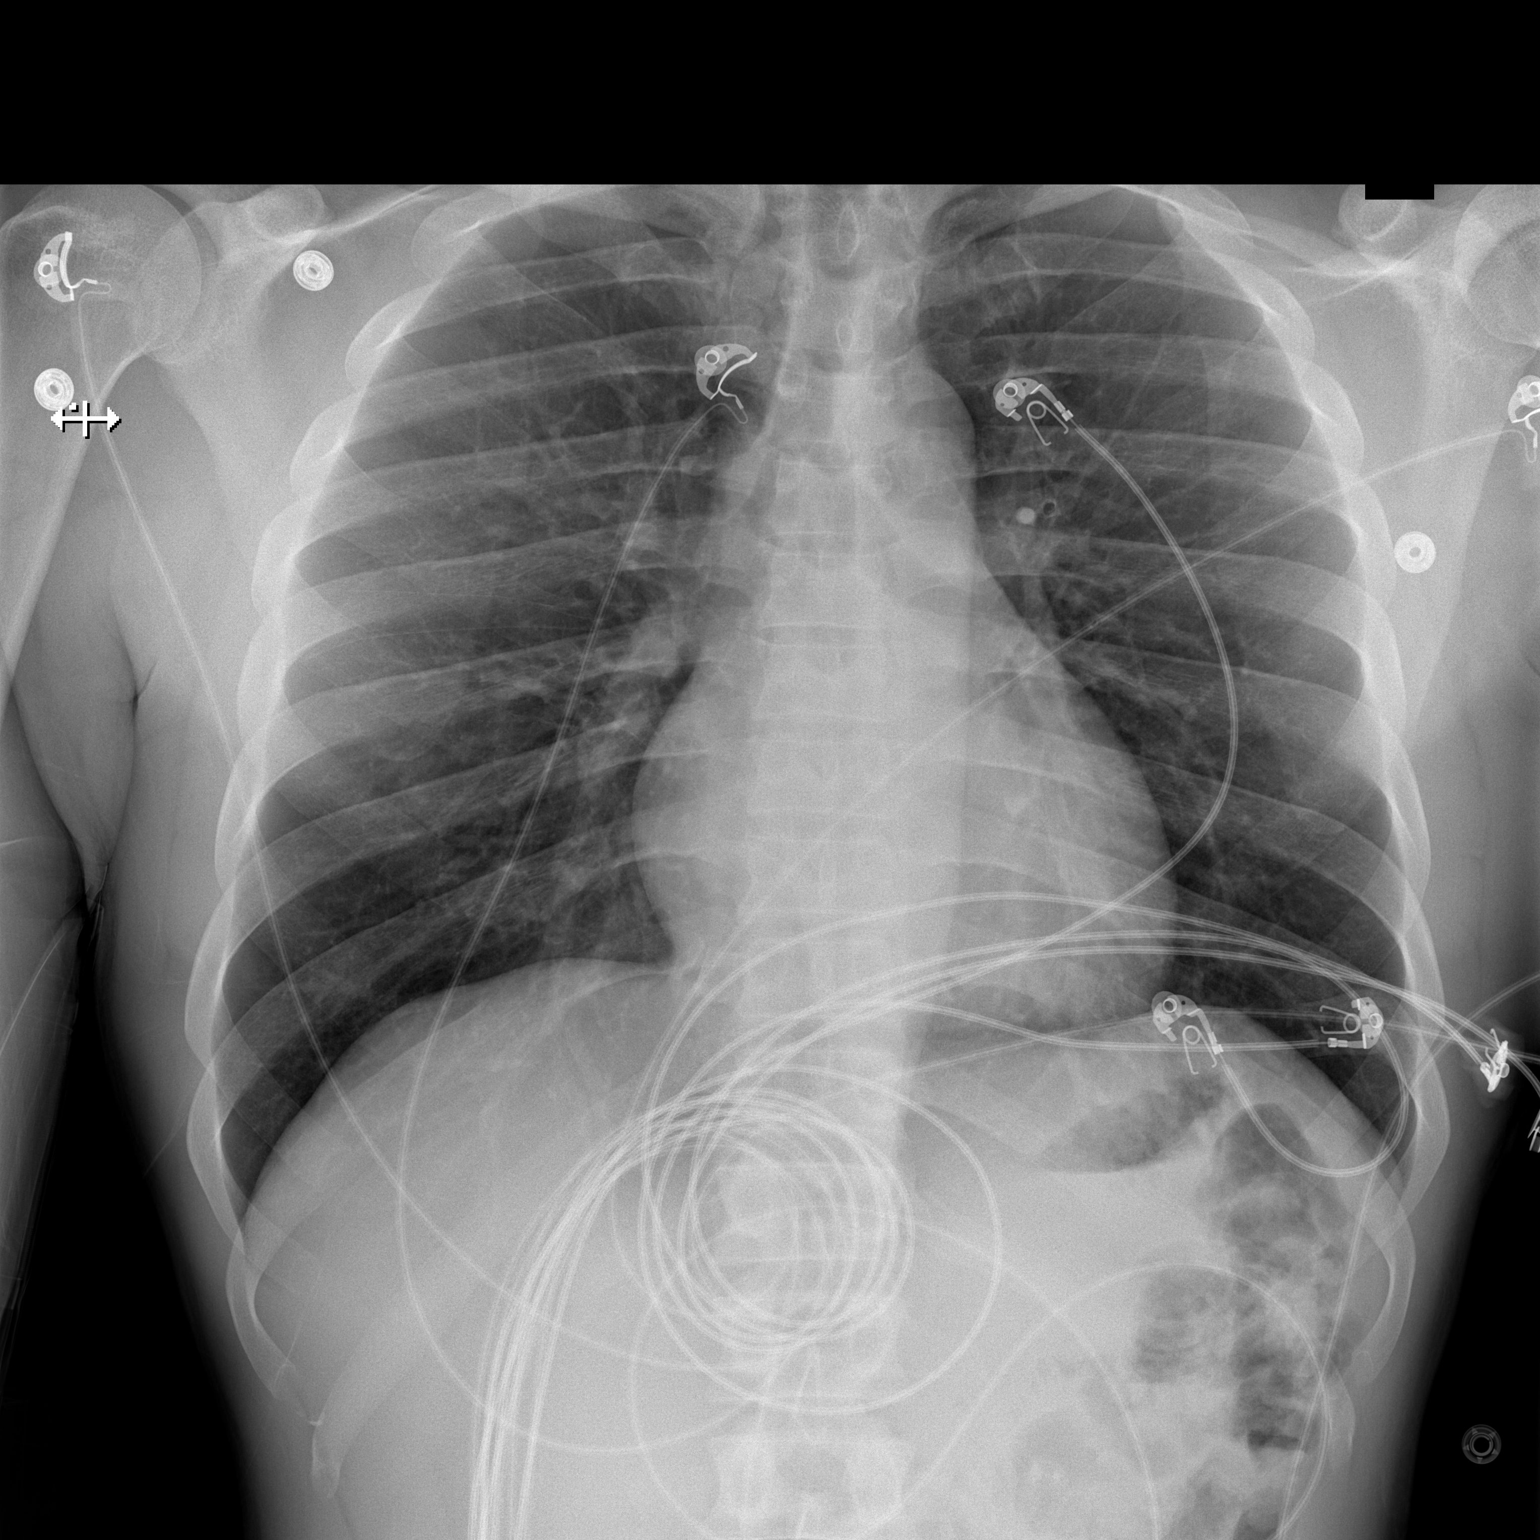

[w chest lat]
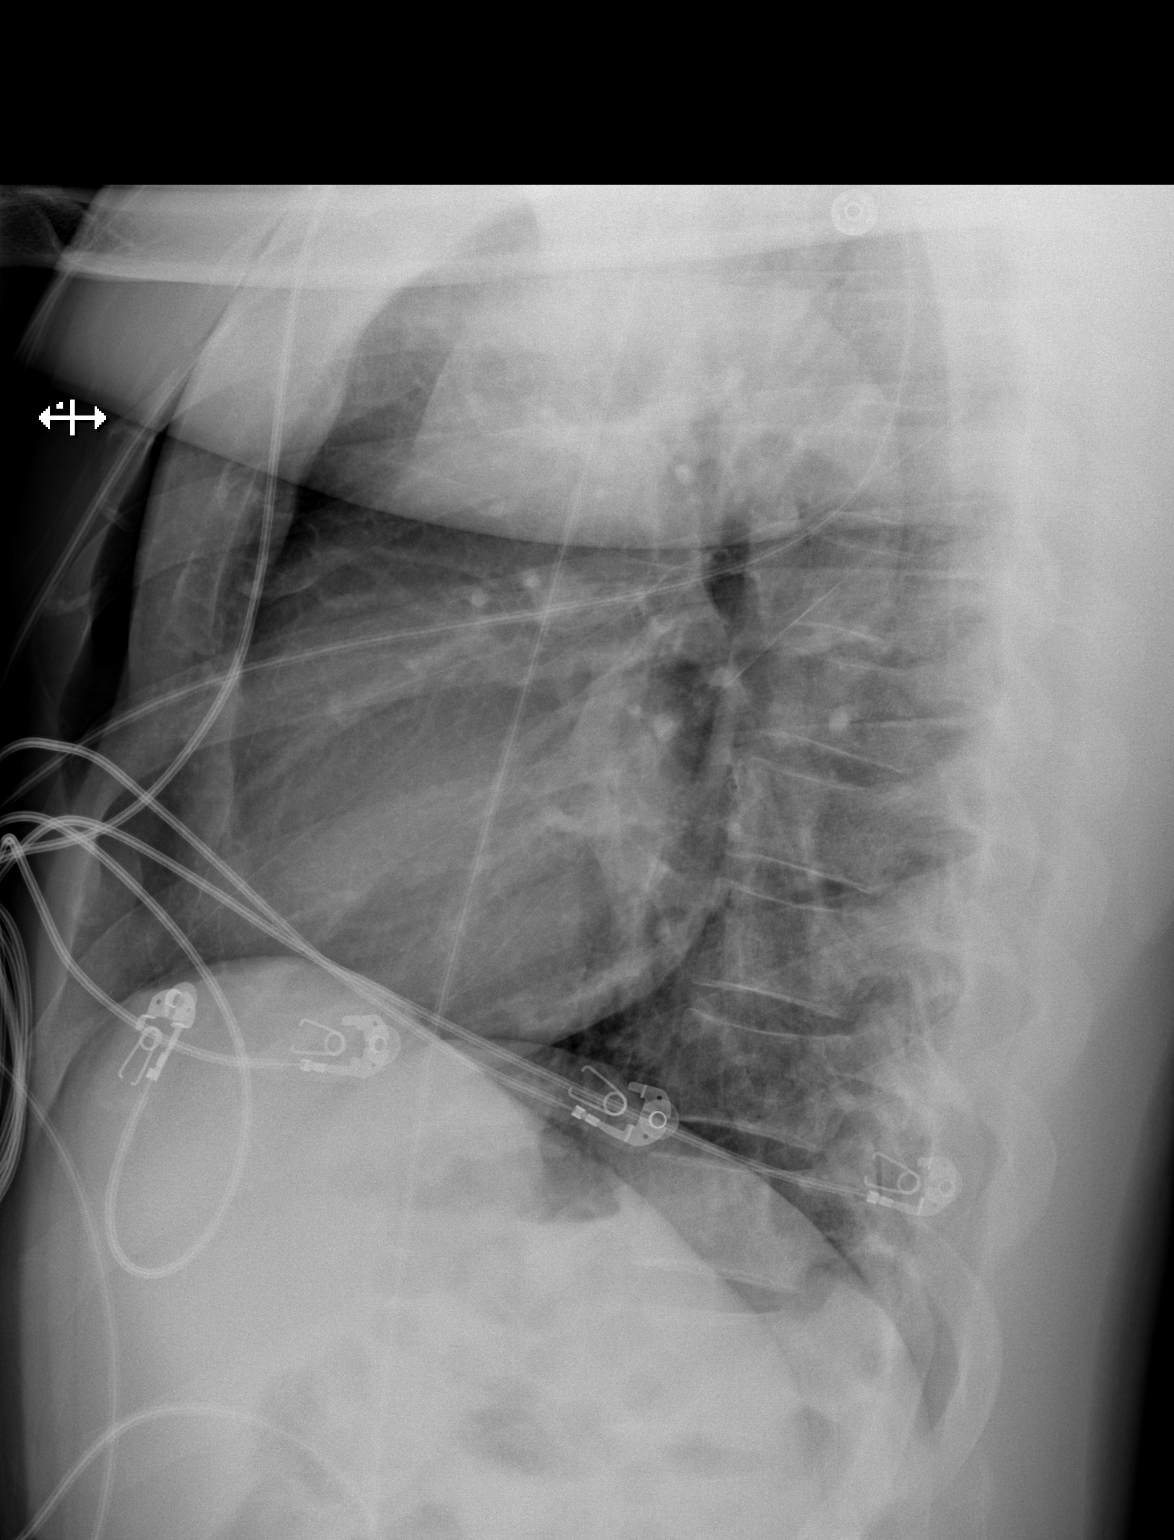

[2 of 2 positions shown; findings below may reference images not displayed]

FINDINGS: The heart size and mediastinal contours are within normal limits.
Both lungs are clear. The visualized skeletal structures are
unremarkable.
IMPRESSION: No active cardiopulmonary disease.

## 2021-02-07 NOTE — Discharge Instructions (Addendum)
Call your primary care doctor or specialist as discussed in the next 2-3 days.   Return immediately back to the ER if:  Your symptoms worsen within the next 12-24 hours. You develop new symptoms such as new fevers, persistent vomiting, new pain, shortness of breath, or new weakness or numbness, or if you have any other concerns.  

## 2021-02-07 NOTE — ED Provider Notes (Signed)
[facility] EMERGENCY DEPARTMENT Provider Note   CSN: [id number] Arrival date & time: 02/07/21  1211     History Chief Complaint  Patient presents with  . Chest Pain    Ricardo Wright is a 40 y.o. male.  HPI 40 year old male presents with transient chest pain. His fiancee woke him up just prior to arrival because it seemed like he was gasping for air in his sleep. Patient notes a couple sharp chest pains that lasted a couple minutes after he woke up with transient dyspnea. Now he feels fine. Has had issues like this when he's slept a couple times before. No apnea. No cough, fever, leg swelling. No significant PMH.  History reviewed. No pertinent past medical history.  There are no problems to display for this patient.   History reviewed. No pertinent surgical history.     Family History  Problem Relation Age of Onset  . Diabetes Mother   . Cancer Mother   . Healthy Father     Social History   Tobacco Use  . Smoking status: Current Every Day Smoker    Packs/day: 1.00    Types: Cigarettes  . Smokeless tobacco: Never Used  Vaping Use  . Vaping Use: Never used  Substance Use Topics  . Alcohol use: Not Currently  . Drug use: Not Currently    Types: Marijuana    Home Medications Prior to Admission medications   Medication Sig Start Date End Date Taking? Authorizing Provider  hydrocortisone (ANUSOL-HC) 2.5 % rectal cream Place 1 application rectally 2 (two) times daily as needed for hemorrhoids or anal itching. Patient not taking: Reported on 02/07/2021 12/09/20   Particia Nearing, PA-C    Allergies    Patient has no known allergies.  Review of Systems   Review of Systems  Constitutional: Negative for fever.  Respiratory: Positive for shortness of breath. Negative for cough.   Cardiovascular: Positive for chest pain. Negative for leg swelling.  Gastrointestinal: Negative for abdominal pain.  All other systems reviewed and are  negative.   Physical Exam Updated Vital Signs BP (!) 129/92   Pulse 78   Temp 98 F (36.7 C) (Oral)   Resp 17   SpO2 100%   Physical Exam Vitals and nursing note reviewed.  Constitutional:      Appearance: He is well-developed.  HENT:     Head: Normocephalic and atraumatic.     Right Ear: External ear normal.     Left Ear: External ear normal.     Nose: Nose normal.  Eyes:     General:        Right eye: No discharge.        Left eye: No discharge.  Cardiovascular:     Rate and Rhythm: Normal rate and regular rhythm.     Heart sounds: Normal heart sounds.  Pulmonary:     Effort: Pulmonary effort is normal.     Breath sounds: Normal breath sounds.  Abdominal:     Palpations: Abdomen is soft.     Tenderness: There is no abdominal tenderness.  Musculoskeletal:     Cervical back: Neck supple.     Right lower leg: No tenderness. No edema.     Left lower leg: No tenderness. No edema.  Skin:    General: Skin is warm and dry.  Neurological:     Mental Status: He is alert.  Psychiatric:        Mood and Affect: Mood is not  anxious.     ED Results / Procedures / Treatments   Labs (all labs ordered are listed, but only abnormal results are displayed) Labs Reviewed  BASIC METABOLIC PANEL - Abnormal; Notable for the following components:      Result Value   Glucose, Bld 105 (*)    Creatinine, Ser 1.36 (*)    All other components within normal limits  CBC WITH DIFFERENTIAL/PLATELET - Abnormal; Notable for the following components:   RBC 4.14 (*)    All other components within normal limits  TROPONIN I (HIGH SENSITIVITY)  TROPONIN I (HIGH SENSITIVITY)    EKG EKG Interpretation  Date/Time:  Saturday February 07 2021 12:14:24 EDT Ventricular Rate:  67 PR Interval:  182 QRS Duration: 76 QT Interval:  381 QTC Calculation: 403 R Axis:   89 Text Interpretation: Sinus rhythm Probable left atrial enlargement Anteroseptal infarct, age indeterminate diffuse ST elevations,  likely early repol No old tracing to compare Confirmed by Pricilla Loveless [phone]) on 02/07/2021 12:22:50 PM   Radiology DG Chest 2 View  Result Date: 02/07/2021 CLINICAL DATA:  Transient chest pain. EXAM: CHEST - 2 VIEW COMPARISON:  None. FINDINGS: The heart size and mediastinal contours are within normal limits. Both lungs are clear. The visualized skeletal structures are unremarkable. IMPRESSION: No active cardiopulmonary disease. Electronically Signed   By: Gerome Sam III M.D   On: 02/07/2021 13:09    Procedures Procedures   Medications Ordered in ED Medications - No data to display  ED Course  I have reviewed the triage vital signs and the nursing notes.  Pertinent labs & imaging results that were available during my care of the patient were reviewed by me and considered in my medical decision making (see chart for details).    MDM Rules/Calculators/A&P                          Patient has very atypical chest pain.  He currently has no chest pain and has not since arrival.  Given the onset, second troponin was obtained, care to Dr. Audley Hose Final Clinical Impression(s) / ED Diagnoses Final diagnoses:  None    Rx / DC Orders ED Discharge Orders    None       Pricilla Loveless, MD 02/07/21 1526

## 2021-02-07 NOTE — ED Triage Notes (Signed)
Per EMS: PTs fiance felt he was "breathing wrong in his sleep" and was clammy and pale. Woke PT up, PT had some left sided chest pain. EMS gave 324 ASA, 18g L AC. Had 3/10 pain. Pain/symptoms resolved in route.

## 2021-03-23 ENCOUNTER — Ambulatory Visit: Payer: Self-pay | Admitting: Cardiology

## 2021-06-01 ENCOUNTER — Encounter: Payer: Self-pay | Admitting: Cardiology

## 2021-06-01 ENCOUNTER — Other Ambulatory Visit: Payer: Self-pay

## 2021-06-01 ENCOUNTER — Ambulatory Visit (INDEPENDENT_AMBULATORY_CARE_PROVIDER_SITE_OTHER): Payer: Self-pay | Admitting: Cardiology

## 2021-06-01 VITALS — BP 106/60 | HR 77 | Ht 74.0 in | Wt 194.8 lb

## 2021-06-01 DIAGNOSIS — R072 Precordial pain: Secondary | ICD-10-CM

## 2021-06-01 DIAGNOSIS — R0689 Other abnormalities of breathing: Secondary | ICD-10-CM

## 2021-06-01 DIAGNOSIS — R079 Chest pain, unspecified: Secondary | ICD-10-CM

## 2021-06-01 MED ORDER — METOPROLOL TARTRATE 100 MG PO TABS: 100.0000 mg | ORAL_TABLET | Freq: Once | ORAL | 0 refills | Status: DC

## 2021-06-01 NOTE — Patient Instructions (Addendum)
Medication Instructions:  Your physician recommends that you continue on your current medications as directed. Please refer to the Current Medication list given to you today.  *If you need a refill on your cardiac medications before your next appointment, please call your pharmacy*   Lab Work: TODAY: BMET If you have labs (blood work) drawn today and your tests are completely normal, you will receive your results only by: MyChart Message (if you have MyChart) OR A paper copy in the mail If you have any lab test that is abnormal or we need to change your treatment, we will call you to review the results.   Testing/Procedures: Your physician has requested that you have an echocardiogram. Echocardiography is a painless test that uses sound waves to create images of your heart. It provides your doctor with information about the size and shape of your heart and how well your heart's chambers and valves are working. This procedure takes approximately one hour. There are no restrictions for this procedure.  Your physician has recommended that you have a sleep study. This test records several body functions during sleep, including: brain activity, eye movement, oxygen and carbon dioxide blood levels, heart rate and rhythm, breathing rate and rhythm, the flow of air through your mouth and nose, snoring, body muscle movements, and chest and belly movement.  Your physician has recommended that you have a coronary CTA scan. Please see below for further instructions.    Follow-Up: At [facility], you and your health needs are our priority.  As part of our continuing mission to provide you with exceptional heart care, we have created designated Provider Care Teams.  These Care Teams include your primary Cardiologist (physician) and Advanced Practice Providers (APPs -  Physician Assistants and Nurse Practitioners) who all work together to provide you with the care you need, when you need it.   Follow  up with Dr. Mayford Knife based on results of testing   Other Instructions   Your cardiac CT will be scheduled at:   [facility] [address] [place], [place] [address] [phone]  Please arrive at the [facility] main entrance (entrance A) of [facility] 30 minutes prior to test start time. Proceed to the [facility] Radiology Department (first floor) to check-in and test prep.   Please follow these instructions carefully (unless otherwise directed):  Hold all erectile dysfunction medications at least 3 days (72 hrs) prior to test.  On the Night Before the Test: Be sure to Drink plenty of water. Do not consume any caffeinated/decaffeinated beverages or chocolate 12 hours prior to your test. Do not take any antihistamines 12 hours prior to your test.   On the Day of the Test: Drink plenty of water until 1 hour prior to the test. Do not eat any food 4 hours prior to the test. You may take your regular medications prior to the test.  Take metoprolol (Lopressor) two hours prior to test.      After the Test: Drink plenty of water. After receiving IV contrast, you may experience a mild flushed feeling. This is normal. On occasion, you may experience a mild rash up to 24 hours after the test. This is not dangerous. If this occurs, you can take Benadryl 25 mg and increase your fluid intake. If you experience trouble breathing, this can be serious. If it is severe call 911 IMMEDIATELY. If it is mild, please call our office.  Please allow 2-4 weeks for scheduling of routine  cardiac CTs. Some insurance companies require a pre-authorization which may delay scheduling of this test.   For non-scheduling related questions, please contact the cardiac imaging nurse navigator should you have any questions/concerns: Rockwell Alexandria, Cardiac Imaging Nurse Navigator Larey Brick, Cardiac Imaging Nurse Navigator [facility] Direct Office Dial:  [phone]   For scheduling needs, including cancellations and rescheduling, please call [place], [phone].

## 2021-06-01 NOTE — Addendum Note (Signed)
Addended by: Theresia Majors on: 06/01/2021 03:41 PM   Modules accepted: Orders

## 2021-06-01 NOTE — Progress Notes (Signed)
Cardiology CONSULT Note    Date:  06/01/2021   ID:  Ansel Bong, DOB [birth year], MRN [id number]  PCP:  Pcp, No  Cardiologist:  Armanda Magic, MD   Chief Complaint  Patient presents with   New Patient (Initial Visit)    Chest pain and awakening gasping for breath     History of Present Illness:  Ricardo Wright is a 40 y.o. male who is being seen today for the evaluation of Chest pain at the request of [place], Eustace Moore, MD.  This is a 40yo AAM with no PMH who presented in April with complaints of chest pain and SOB.  Apparently his fiance had noticed that he was gasping for air in his sleep and then he had a few sharp pains as well has heaviness like a brick sitting on his chest lasting a few minutes with transient SOB which then resolved.  He states that he has had a few issues like this in the past when sleeping.  He has a hx of ongoing tobacco use but no family history of CAD.  His cherst pain was felt to be atypical and hs trop was neg x 2 and EKG showed diffuse ST elevation in the anterior and inferior leads felt to represent early repolarization.   He is now here for cardiac eval.   He has continued to have sharp pains in his chest along with exertional fatigue and SOB.  His fiance says that he wakes up gasping for breath as well as loud snoring.  He says that he gets the chest pain randomly throughout the day both with exertion and at rest.  He has associated pain and tingling in his left arm.  He also gets SOB with the discomfort and feels dizzy like he is off balance and makes his vision blurry.  There is some diaphoresis but says that he sweats at work normally.  The pain is occurring now at least a few times weekly and lasts anywhere from a few seconds to 30 minutes.  He admits to smoking tobacco, marijuana and cocaine but has been a while since he did cocaine. He denies any palpitations, LE edema or syncope.   Past Medical History:  Diagnosis Date   Chest pain    Dyspnea     No  past surgical history on file.  Current Medications: No outpatient medications have been marked as taking for the 06/01/21 encounter (Office Visit) with Quintella Reichert, MD.    Allergies:   Patient has no known allergies.   Social History   Socioeconomic History   Marital status: Single    Spouse name: Not on file   Number of children: Not on file   Years of education: Not on file   Highest education level: Not on file  Occupational History   Not on file  Tobacco Use   Smoking status: Every Day    Packs/day: 1.00    Types: Cigarettes   Smokeless tobacco: Never  Vaping Use   Vaping Use: Never used  Substance and Sexual Activity   Alcohol use: Not Currently   Drug use: Not Currently    Types: Marijuana   Sexual activity: Yes  Other Topics Concern   Not on file  Social History Narrative   Not on file   Social Determinants of Health   Financial Resource Strain: Not on file  Food Insecurity: Not on file  Transportation Needs: Not on file  Physical Activity: Not on file  Stress: Not  on file  Social Connections: Not on file     Family History:  The patient's family history includes Cancer in his mother; Diabetes in his mother; Healthy in his father; Heart disease in his mother.   ROS:   Please see the history of present illness.    ROS All other systems reviewed and are negative.  No flowsheet data found.     PHYSICAL EXAM:   VS:  BP 106/60   Pulse 77   Ht 6\' 2"  (1.88 m)   Wt 194 lb 12.8 oz (88.4 kg)   SpO2 98%   BMI 25.01 kg/m    GEN: Well nourished, well developed, in no acute distress  HEENT: normal  Neck: no JVD, carotid bruits, or masses Cardiac: RRR; no murmurs, rubs, or gallops,no edema.  Intact distal pulses bilaterally.  Respiratory:  clear to auscultation bilaterally, normal work of breathing GI: soft, nontender, nondistended, + BS MS: no deformity or atrophy  Skin: warm and dry, no rash Neuro:  Alert and Oriented x 3, Strength and sensation  are intact Psych: euthymic mood, full affect  Wt Readings from Last 3 Encounters:  06/01/21 194 lb 12.8 oz (88.4 kg)  07/23/19 220 lb (99.8 kg)  05/01/18 230 lb (104.3 kg)      Studies/Labs Reviewed:   EKG:  EKG is not ordered today.   Recent Labs: 02/07/2021: BUN 11; Creatinine, Ser 1.36; Hemoglobin 13.6; Platelets 263; Potassium 3.8; Sodium 140   Lipid Panel No results found for: CHOL, TRIG, HDL, CHOLHDL, VLDL, LDLCALC, LDLDIRECT  Additional studies/ records that were reviewed today include:  ER records from April 2022, EKG and labs    ASSESSMENT:    1. Chest pain of uncertain etiology   2. Gasping for breath      PLAN:  In order of problems listed above:  Chest pain -this was very atypical and occurred when waking up gasping for breath while sleeping -he denies any exertional symptoms -he does have ongoing tobacco use and therefore at risk for early CAD -I will get a coronary CTA to define coronary anatomy -Shared Decision Making/Informed Consent The risks [chest pain, shortness of breath, cardiac arrhythmias, dizziness, blood pressure fluctuations, myocardial infarction, stroke/transient ischemic attack, and life-threatening complications (estimated to be 1 in 10,000)], benefits (risk stratification, diagnosing coronary artery disease, treatment guidance) and alternatives of an exercise tolerance test were discussed in detail with [name] and he agrees to proceed. -check 2D echo to assess LVF  2.  Awakening gasping for breath -I am suspicious that he may have sleep apnea -I will get a PSG to rule out sleep disordered breathing   Time Spent: 25 minutes total time of encounter, including 15 minutes spent in face-to-face patient care on the date of this encounter. This time includes coordination of care and counseling regarding above mentioned problem list. Remainder of non-face-to-face time involved reviewing chart documents/testing relevant to the patient  encounter and documentation in the medical record. I have independently reviewed documentation from referring provider  Medication Adjustments/Labs and Tests Ordered: Current medicines are reviewed at length with the patient today.  Concerns regarding medicines are outlined above.  Medication changes, Labs and Tests ordered today are listed in the Patient Instructions below.  There are no Patient Instructions on file for this visit.   Signed, Eduard Clos, MD  06/01/2021 3:30 PM    [facility] [address] [place], [place], [place]  [place] Phone: [phone]; Fax: [phone]

## 2021-06-02 LAB — BASIC METABOLIC PANEL
BUN/Creatinine Ratio: 10 (ref 9–20)
BUN: 11 mg/dL (ref 6–24)
CO2: 23 mmol/L (ref 20–29)
Calcium: 9.6 mg/dL (ref 8.7–10.2)
Chloride: 104 mmol/L (ref 96–106)
Creatinine, Ser: 1.12 mg/dL (ref 0.76–1.27)
Glucose: 90 mg/dL (ref 65–99)
Potassium: 4.3 mmol/L (ref 3.5–5.2)
Sodium: 142 mmol/L (ref 134–144)
eGFR: 85 mL/min/{1.73_m2} (ref >59)

## 2021-06-08 ENCOUNTER — Telehealth (HOSPITAL_COMMUNITY): Payer: Self-pay | Admitting: Emergency Medicine

## 2021-06-08 NOTE — Telephone Encounter (Signed)
Reaching out to patient to offer assistance regarding upcoming cardiac imaging study; pt verbalizes understanding of appt date/time, parking situation and where to check in, pre-test NPO status and medications ordered, and verified current allergies; name and call back number provided for further questions should they arise Rockwell Alexandria RN Navigator Cardiac Imaging [facility] [phone] office [phone] cell  Denies claustro Denies iv issues 100mg  metoprolol

## 2021-06-10 ENCOUNTER — Encounter (HOSPITAL_COMMUNITY): Payer: Self-pay

## 2021-06-10 ENCOUNTER — Other Ambulatory Visit: Payer: Self-pay

## 2021-06-10 ENCOUNTER — Ambulatory Visit (HOSPITAL_COMMUNITY)
Admission: RE | Admit: 2021-06-10 | Discharge: 2021-06-10 | Disposition: A | Discharge status: Home / Self Care | Payer: Self-pay | Source: Ambulatory Visit | Attending: Cardiology | Admitting: Cardiology

## 2021-06-10 DIAGNOSIS — R072 Precordial pain: Secondary | ICD-10-CM

## 2021-06-10 IMAGING — CT CT HEART MORP W/ CTA COR W/ SCORE W/ CA W/CM &/OR W/O CM
4 of 7 series · 8 of 20 positions shown, 9 images · IV contrast (APPLIED)
Comparison: None.
COMPARISON: None.

Addendum:
EXAM:
OVER-READ INTERPRETATION  CT CHEST

The following report is an over-read performed by radiologist Dr.
BASABE [REDACTED] on [DATE]. This over-read
does not include interpretation of cardiac or coronary anatomy or
pathology. The coronary CTA interpretation by the cardiologist is
attached.
CLINICAL DATA: Chest pain
Cardiac CTA
MEDICATIONS:
Sub lingual nitro. 4mg x 2
TECHNIQUE: The patient was scanned on a Siemens [REDACTED]ice scanner. Gantry
rotation speed was 250 msecs. Collimation was 0.6 mm. A 100 kV
prospective scan was triggered in the ascending thoracic aorta at
35-75% of the R-R interval. Average HR during the scan was 60 bpm.
The 3D data set was interpreted on a dedicated work station using
MPR, MIP and VRT modes. A total of 80cc of contrast was used.

[Series 6: best diast 72 % · axial · 0.39mm/px · z∈[+1241,+1289]mm · 2 of 361 slices shown, 3 images]
[im 121/361  vessel]
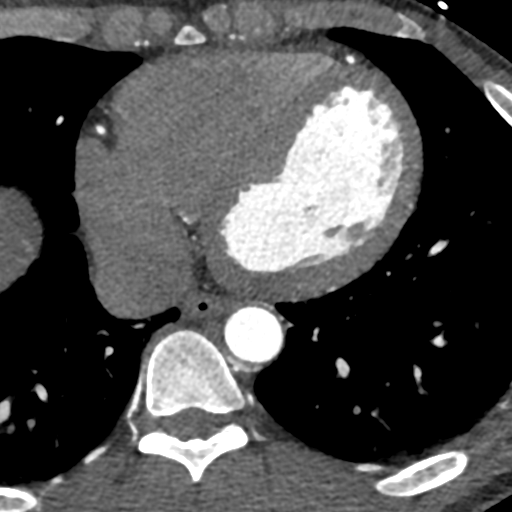
[im 121/361  lung]
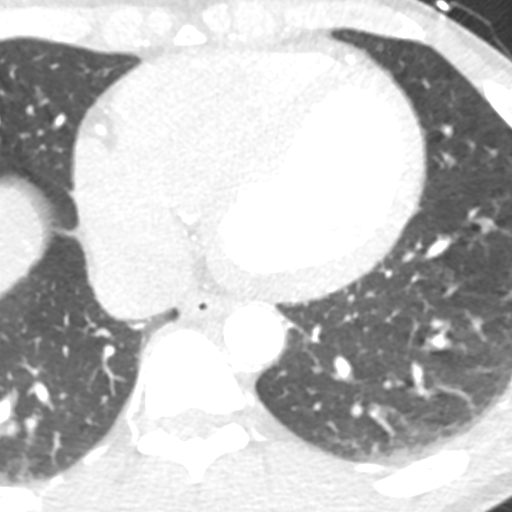
[im 241/361  vessel]
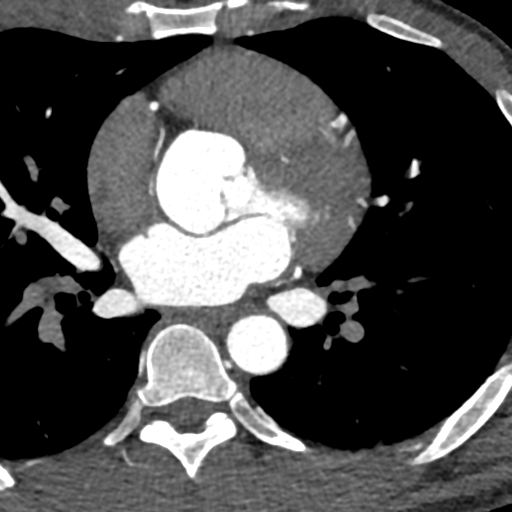

[Series 7: best syst 31 % · axial · 0.39mm/px · z∈[+1241,+1289]mm · 2 of 361 slices shown]
[im 121/361  vessel]
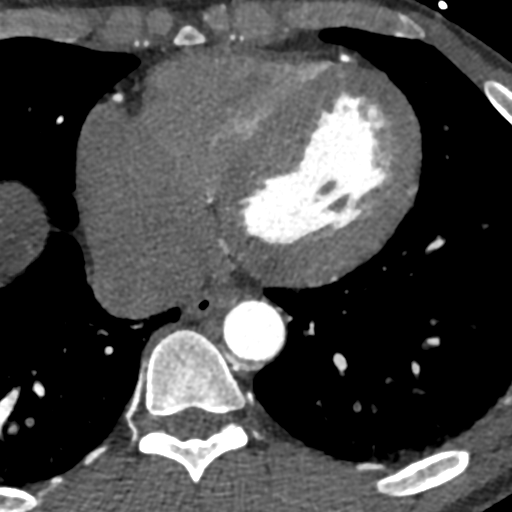
[im 241/361  vessel]
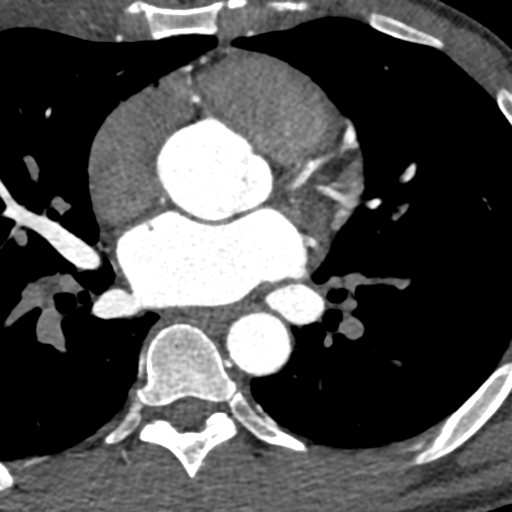

[Series 8: ts diast sharp 72 % · axial · 0.39mm/px · z∈[+1241,+1289]mm · 2 of 361 slices shown]
[im 121/361  lung]
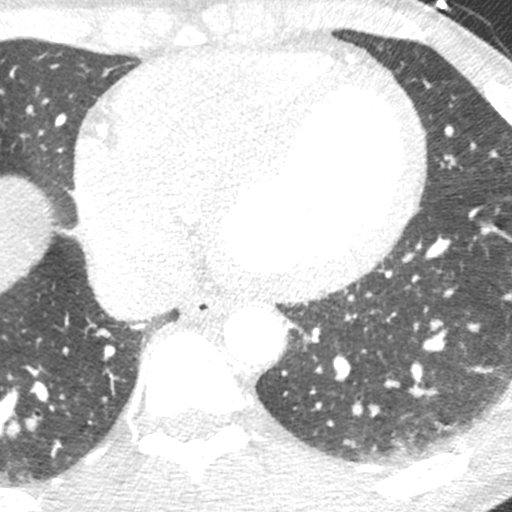
[im 241/361  lung]
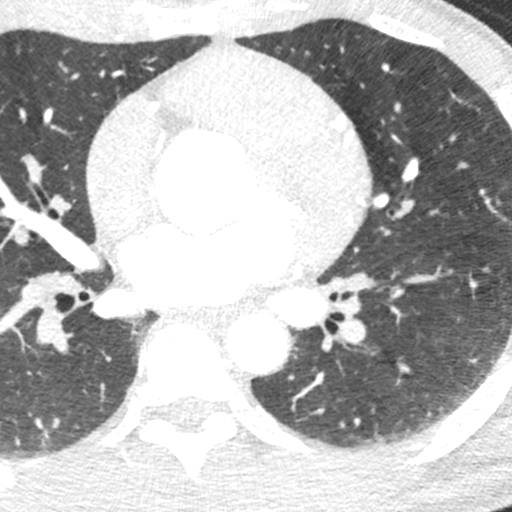

[Series 9: ts syst sharp 31 % · axial · 0.39mm/px · z∈[+1241,+1289]mm · 2 of 361 slices shown]
[im 121/361  lung]
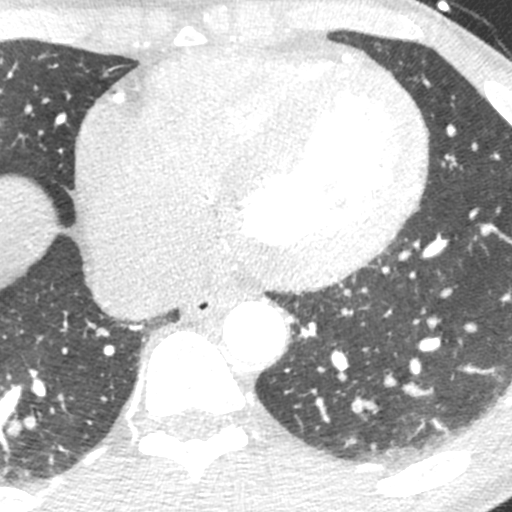
[im 241/361  lung]
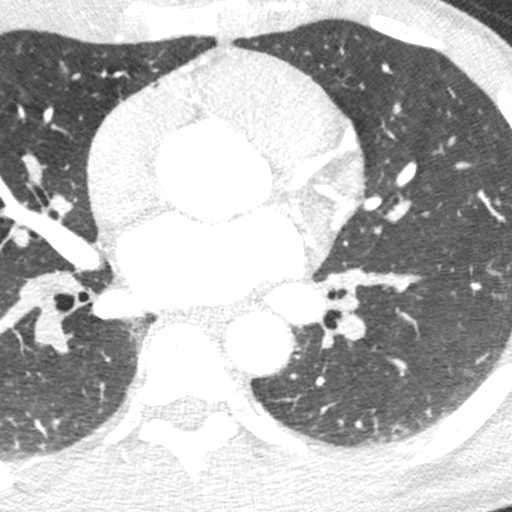

[8 of 20 positions shown; findings below may reference images not displayed]

FINDINGS: Vascular: Aorta normal caliber.  Heart normal size.

Mediastinum/Nodes: No adenopathy

Lungs/Pleura: No confluent opacities or effusions.

Upper Abdomen: Enhancement of a 7 mm area within the right hepatic
dome, likely flash filling of a hemangioma.

Musculoskeletal: Chest wall soft tissues are unremarkable. No acute
bony abnormality.
IMPRESSION: No acute extra cardiac abnormality.

7 mm enhancing area within the right hepatic dome, likely acute
flash filling of a hemangioma. This could be confirmed with right
upper quadrant ultrasound as clinically indicated.
FINDINGS: Non-cardiac: See separate report from [REDACTED].

Pulmonary veins drained normally to the left atrium. No LA appendage
thrombus.

Calcium Score: 0 Agatston units.

Coronary Arteries: Right dominant with no anomalies

LM: Short, no plaque or stenosis.

LAD system: No plaque or stenosis.

Circumflex system: No plaque or stenosis.

RCA system: No plaque or stenosis.
IMPRESSION: 1. Coronary artery calcium score 0 Agatston units, suggesting low
risk for future cardiac events.

2.  No significant coronary disease noted.

BASABE

*** End of Addendum ***
EXAM:
OVER-READ INTERPRETATION  CT CHEST

The following report is an over-read performed by radiologist Dr.
BASABE [REDACTED] on [DATE]. This over-read
does not include interpretation of cardiac or coronary anatomy or
pathology. The coronary CTA interpretation by the cardiologist is
attached.
FINDINGS: Vascular: Aorta normal caliber.  Heart normal size.

Mediastinum/Nodes: No adenopathy

Lungs/Pleura: No confluent opacities or effusions.

Upper Abdomen: Enhancement of a 7 mm area within the right hepatic
dome, likely flash filling of a hemangioma.

Musculoskeletal: Chest wall soft tissues are unremarkable. No acute
bony abnormality.
IMPRESSION: No acute extra cardiac abnormality.

7 mm enhancing area within the right hepatic dome, likely acute
flash filling of a hemangioma. This could be confirmed with right
upper quadrant ultrasound as clinically indicated.

## 2021-06-10 MED ORDER — NITROGLYCERIN 0.4 MG SL SUBL
SUBLINGUAL_TABLET | SUBLINGUAL | Status: AC
  Filled 2021-06-10: qty 2

## 2021-06-10 MED ORDER — NITROGLYCERIN 0.4 MG SL SUBL
0.8000 mg | SUBLINGUAL_TABLET | Freq: Once | SUBLINGUAL | Status: AC
  Administered 2021-06-10: 0.8 mg via SUBLINGUAL

## 2021-06-10 MED ORDER — IOHEXOL 350 MG/ML SOLN
95.0000 mL | Freq: Once | INTRAVENOUS | Status: AC | PRN
  Administered 2021-06-10: 95 mL via INTRAVENOUS

## 2021-06-17 ENCOUNTER — Ambulatory Visit (HOSPITAL_COMMUNITY): Payer: Self-pay | Attending: Cardiology

## 2021-06-17 ENCOUNTER — Other Ambulatory Visit: Payer: Self-pay

## 2021-06-17 DIAGNOSIS — R0689 Other abnormalities of breathing: Secondary | ICD-10-CM

## 2021-06-17 DIAGNOSIS — R079 Chest pain, unspecified: Secondary | ICD-10-CM

## 2021-06-17 DIAGNOSIS — R072 Precordial pain: Secondary | ICD-10-CM

## 2021-06-18 LAB — ECHOCARDIOGRAM COMPLETE
Area-P 1/2: 2.93 cm2
S' Lateral: 2.8 cm

## 2021-06-19 ENCOUNTER — Telehealth: Payer: Self-pay | Admitting: *Deleted

## 2021-06-19 ENCOUNTER — Ambulatory Visit: Payer: Self-pay

## 2021-06-19 NOTE — Telephone Encounter (Signed)
Patient is scheduled for lab study on 06/30/21 @ 8 am in the morning. Patient understands his sleep study will be done at [facility] sleep lab. Patient understands he will receive a sleep packet in a week or so. Patient understands to call if he does not receive the sleep packet in a timely manner. Patient agrees with treatment and thanked me for call No message no voicemail set-up.

## 2021-06-19 NOTE — Telephone Encounter (Signed)
-----   Message from Ricardo Majors, RN sent at 06/01/2021  3:59 PM EDT ----- PSG sleep study has been ordered. FYI - patient works nights, so not sure if they have availability to do studies during the day.  Thanks!

## 2021-06-25 ENCOUNTER — Telehealth: Payer: Self-pay

## 2021-06-25 DIAGNOSIS — R079 Chest pain, unspecified: Secondary | ICD-10-CM

## 2021-06-25 NOTE — Telephone Encounter (Signed)
-----   Message from Quintella Reichert, MD sent at 06/22/2021  9:20 PM EDT ----- Echo showed normal heart function with thickening of the heart muscle, trivial leakiness of the MV which is normal, mildly dilated aortic root but normal by coronary CT.  Please get cMRI with gad to rule out HOCM gven LVH

## 2021-06-25 NOTE — Telephone Encounter (Signed)
The patient has been notified of the result and verbalized understanding.  All questions (if any) were answered. Theresia Majors, RN 06/25/2021 11:07 AM  MRI has been ordered.

## 2021-06-30 ENCOUNTER — Other Ambulatory Visit: Payer: Self-pay

## 2021-06-30 ENCOUNTER — Ambulatory Visit (HOSPITAL_BASED_OUTPATIENT_CLINIC_OR_DEPARTMENT_OTHER): Payer: Self-pay | Attending: Cardiology | Admitting: Cardiology

## 2021-06-30 DIAGNOSIS — R0683 Snoring: Secondary | ICD-10-CM | POA: Insufficient documentation

## 2021-06-30 DIAGNOSIS — R0689 Other abnormalities of breathing: Secondary | ICD-10-CM | POA: Insufficient documentation

## 2021-06-30 DIAGNOSIS — R079 Chest pain, unspecified: Secondary | ICD-10-CM | POA: Insufficient documentation

## 2021-06-30 DIAGNOSIS — R072 Precordial pain: Secondary | ICD-10-CM | POA: Insufficient documentation

## 2021-07-03 ENCOUNTER — Ambulatory Visit: Payer: Self-pay

## 2021-07-12 NOTE — Procedures (Signed)
    Patient Name: Ricardo Wright, Ricardo Wright Date:06/30/2021 Gender: Male D.O.B: 1980/12/08 Age (years): 40 Referring Provider: Armanda Magic MD, ABSM Height (inches): 74 Interpreting Physician: Armanda Magic MD, ABSM Weight (lbs): 195 RPSGT: Conner Sink BMI: 25 MRN: [id number] Neck Size: 16.00  CLINICAL INFORMATION Sleep Study Type: NPSG  Indication for sleep study: N/A  Epworth Sleepiness Score: 8  SLEEP STUDY TECHNIQUE As per the AASM Manual for the Scoring of Sleep and Associated Events v2.3 (April 2016) with a hypopnea requiring 4% desaturations.  The channels recorded and monitored were frontal, central and occipital EEG, electrooculogram (EOG), submentalis EMG (chin), nasal and oral airflow, thoracic and abdominal wall motion, anterior tibialis EMG, snore microphone, electrocardiogram, and pulse oximetry.  MEDICATIONS Medications self-administered by patient taken the night of the study : N/A  SLEEP ARCHITECTURE The study was initiated at 9:21:23 AM and ended at 3:25:30 PM.  Sleep onset time was 86.6 minutes and the sleep efficiency was 65.9%. The total sleep time was 240 minutes.  Stage REM latency was 59.5 minutes.  The patient spent 19.0% of the night in stage N1 sleep, 60.2% in stage N2 sleep, 0.0% in stage N3 and 20.8% in REM.  Alpha intrusion was absent.  Supine sleep was 43.96%.  RESPIRATORY PARAMETERS The overall apnea/hypopnea index (AHI) was 0.0 per hour. There were 0 total apneas, including 0 obstructive, 0 central and 0 mixed apneas. There were 0 hypopneas and 0 RERAs.  The AHI during Stage REM sleep was 0.0 per hour.  AHI while supine was 0.0 per hour.  The mean oxygen saturation was 95.6%. The minimum SpO2 during sleep was 91.0%.  loud snoring was noted during this study.  CARDIAC DATA The 2 lead EKG demonstrated sinus rhythm. The mean heart rate was 66.1 beats per minute. Other EKG findings include: None.  LEG MOVEMENT DATA The total PLMS  were 0 with a resulting PLMS index of 0.0. Associated arousal with leg movement index was 0.0 .  IMPRESSIONS - No significant obstructive sleep apnea occurred during this study (AHI = 0.0/h). - The patient had minimal or no oxygen desaturation during the study (Min O2 = 91.0%) - The patient snored with loud snoring volume. - No cardiac abnormalities were noted during this study. - Clinically significant periodic limb movements did not occur during sleep. No significant associated arousals.  DIAGNOSIS - Normal Study  RECOMMENDATIONS - Avoid alcohol, sedatives and other CNS depressants that may worsen sleep apnea and disrupt normal sleep architecture. - Sleep hygiene should be reviewed to assess factors that may improve sleep quality. - Weight management and regular exercise should be initiated or continued if appropriate.  [Electronically signed] 07/12/2021 10:04 PM  Armanda Magic MD, ABSM Diplomate, American Board of Sleep Medicine

## 2021-07-17 ENCOUNTER — Telehealth: Payer: Self-pay | Admitting: *Deleted

## 2021-07-17 NOTE — Telephone Encounter (Signed)
The patient has been notified of the result and verbalized understanding.  All questions (if any) were answered. Ricardo Wright, CMA 07/17/2021 2:31 PM   Pt is aware and agreeable to normal results.

## 2021-07-17 NOTE — Telephone Encounter (Signed)
-----   Message from Quintella Reichert, MD sent at 07/12/2021 10:06 PM EDT ----- Please let patient know that sleep study showed no significant sleep apnea.

## 2021-07-27 ENCOUNTER — Ambulatory Visit: Payer: Self-pay

## 2021-07-27 ENCOUNTER — Ambulatory Visit (HOSPITAL_COMMUNITY): Payer: Self-pay

## 2021-07-29 ENCOUNTER — Ambulatory Visit
Admission: EM | Admit: 2021-07-29 | Discharge: 2021-07-29 | Disposition: A | Discharge status: Home / Self Care | Payer: Self-pay | Attending: Emergency Medicine | Admitting: Emergency Medicine

## 2021-07-29 ENCOUNTER — Other Ambulatory Visit: Payer: Self-pay

## 2021-07-29 ENCOUNTER — Ambulatory Visit (HOSPITAL_BASED_OUTPATIENT_CLINIC_OR_DEPARTMENT_OTHER)
Admission: RE | Admit: 2021-07-29 | Discharge: 2021-07-29 | Disposition: A | Discharge status: Home / Self Care | Payer: Self-pay | Source: Ambulatory Visit | Attending: Emergency Medicine | Admitting: Emergency Medicine

## 2021-07-29 DIAGNOSIS — M25562 Pain in left knee: Secondary | ICD-10-CM | POA: Insufficient documentation

## 2021-07-29 DIAGNOSIS — S8992XA Unspecified injury of left lower leg, initial encounter: Secondary | ICD-10-CM

## 2021-07-29 DIAGNOSIS — M25862 Other specified joint disorders, left knee: Secondary | ICD-10-CM | POA: Insufficient documentation

## 2021-07-29 IMAGING — DX DG KNEE COMPLETE 4+V*L*
4 series · 4 of 4 positions shown · non-contrast
Comparison: None.

CLINICAL DATA: Medial left knee pain concerning for meniscal tear,
strain.

EXAM:
LEFT KNEE - COMPLETE 4+ VIEW

[knee ap]
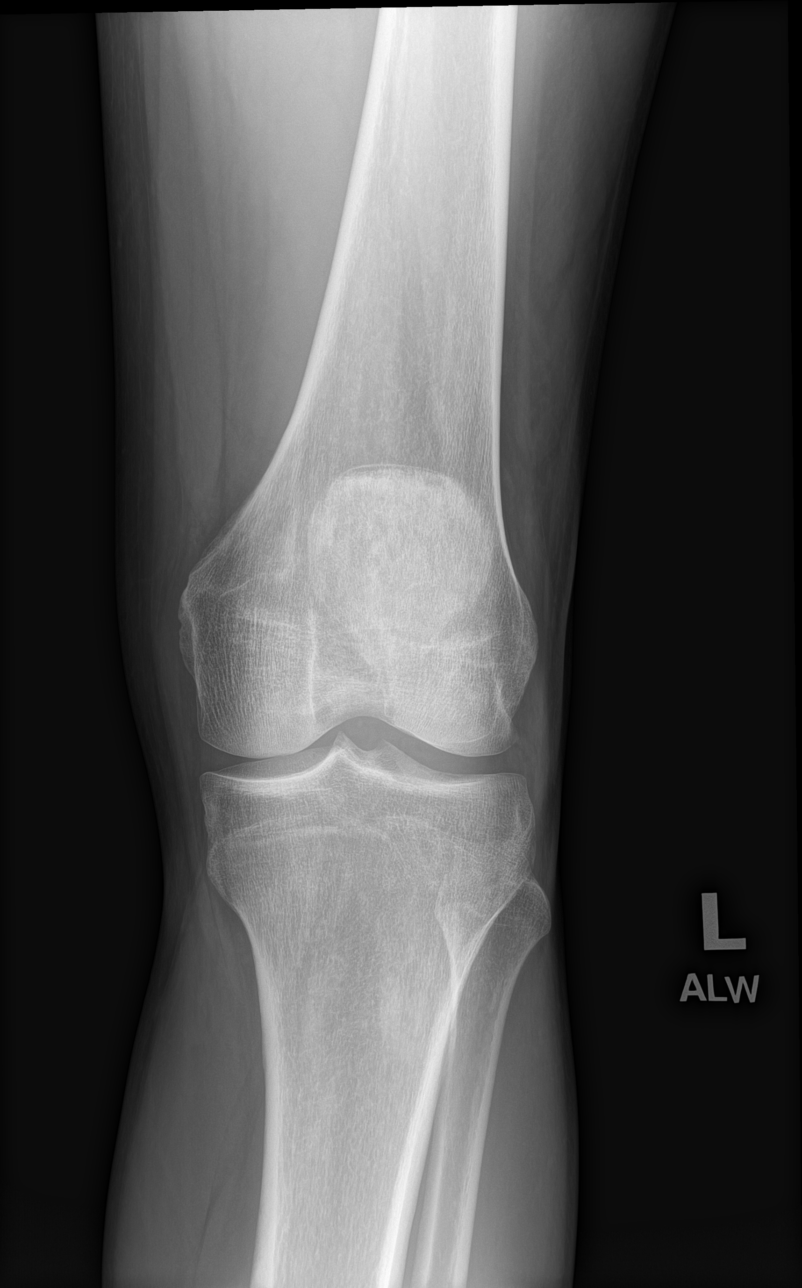

[knee obl (1 of 2)]
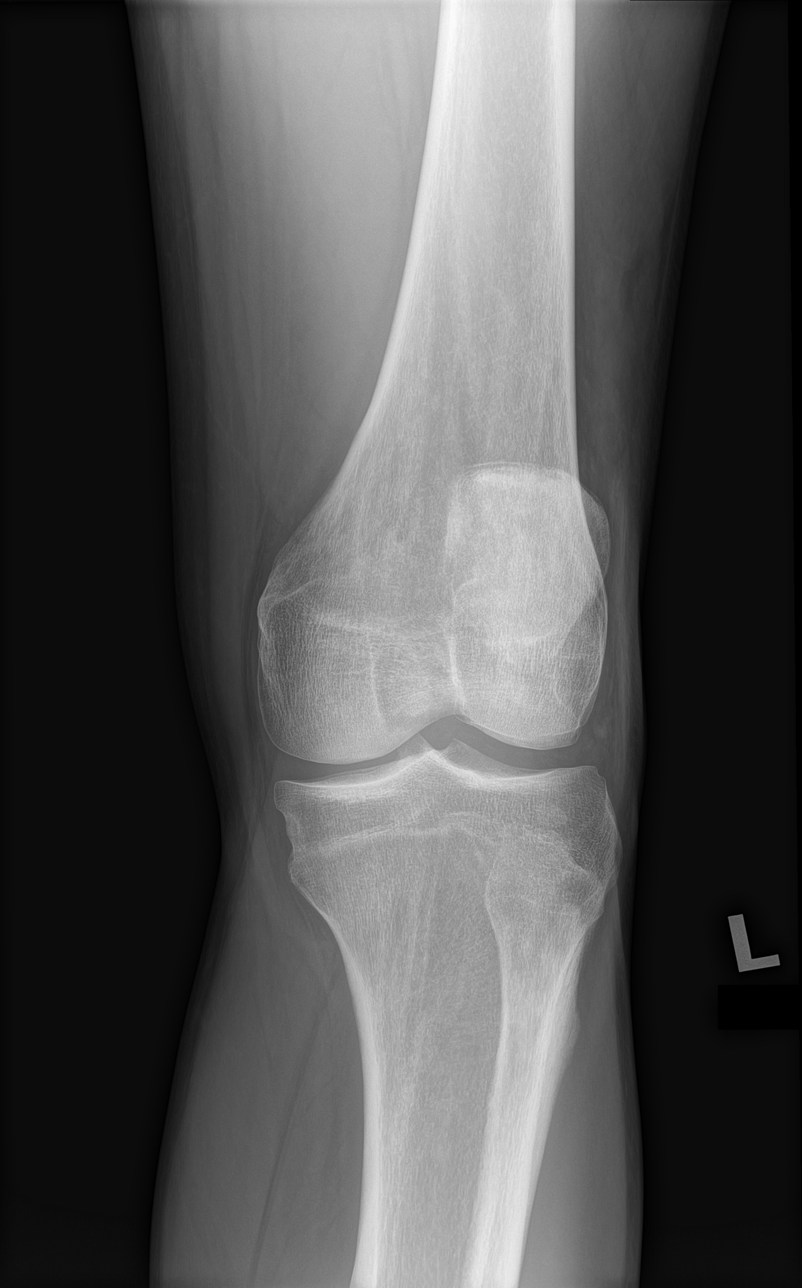

[knee obl (2 of 2)]
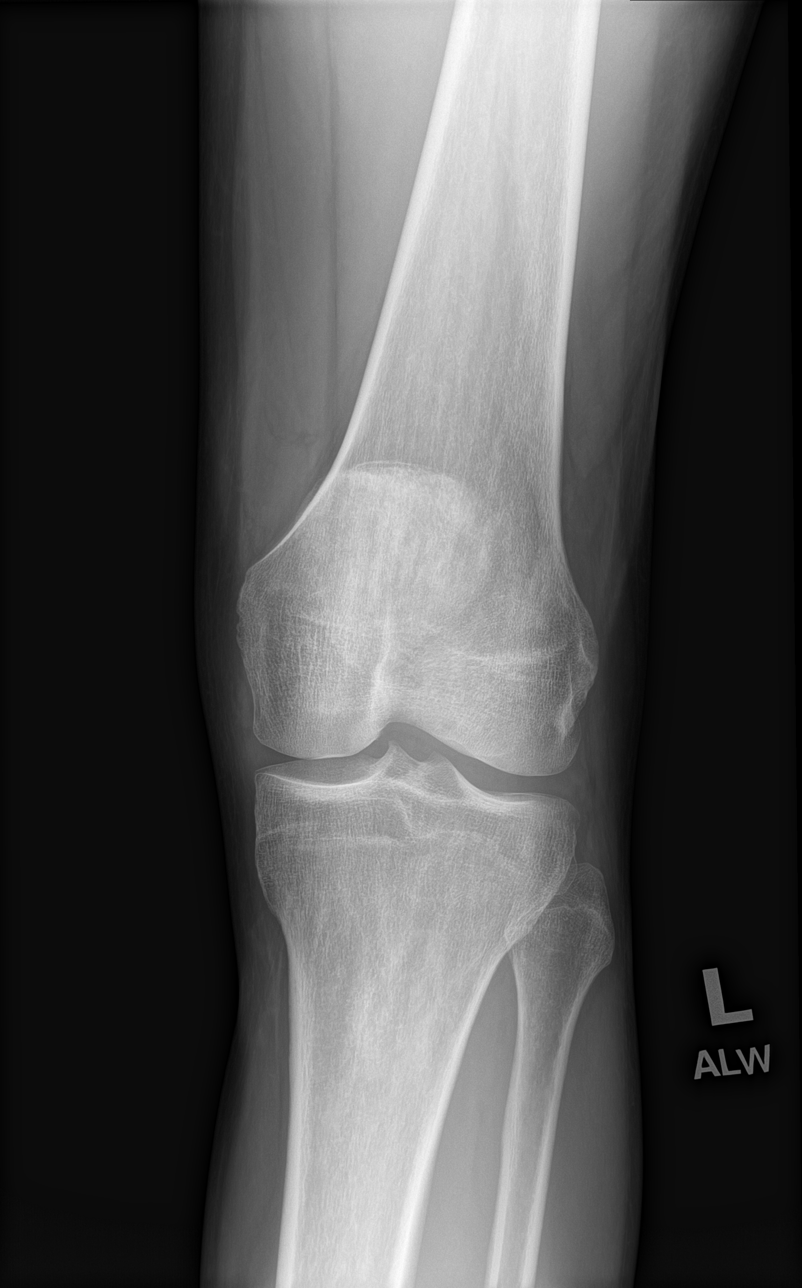

[knee lat]
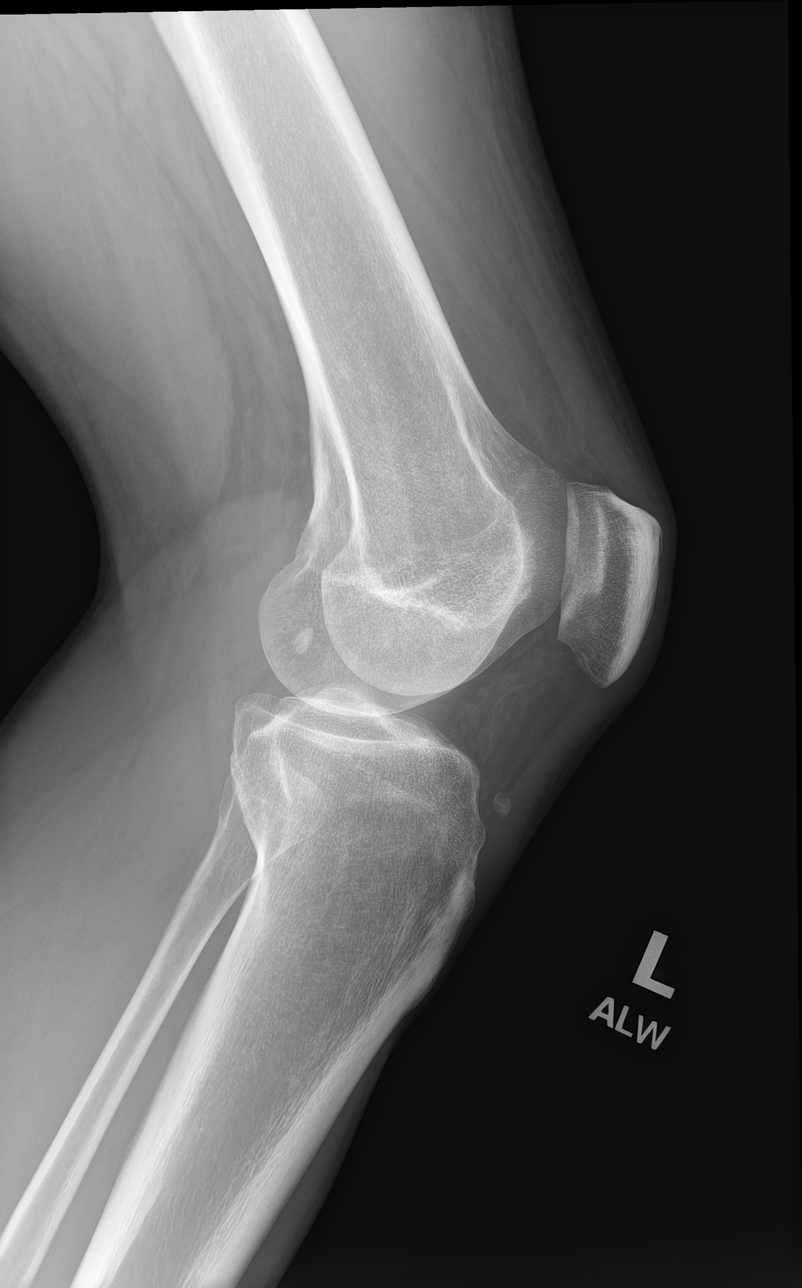

[4 of 4 positions shown; findings below may reference images not displayed]

FINDINGS: The joint spaces are preserved. Normal alignment. No erosions or
osteophytes. No fracture or bony destruction. Small soft tissue
calcification in region of the patellar insertion of the patellar
tendon may be within the tendon or Hoffa's fat pad, of doubtful
clinical significance. No significant knee joint effusion.
IMPRESSION: Unremarkable radiographs of the left knee.

## 2021-07-29 MED ORDER — NAPROXEN 375 MG PO TABS: 375.0000 mg | ORAL_TABLET | Freq: Two times a day (BID) | ORAL | 0 refills | Status: AC

## 2021-07-29 NOTE — Discharge Instructions (Addendum)
Please have x-ray of her left knee performed as ordered, the location has been provided to you within that this document.  In the meantime, for your pain, recommend that you take naproxen 375 mg twice daily and for stability continue to wear your brace.  I provided you with the names of a few orthopedic providers that you can contact if your left knee x-ray reveals any abnormalities.  We will provide you with the results of her x-ray once they are received.

## 2021-07-29 NOTE — ED Provider Notes (Signed)
[facility]    CSN: [id number] Arrival date & time:         History   Chief Complaint Chief Complaint  Patient presents with   Knee Pain    HPI Ricardo Wright is a 40 y.o. male.   Patient states he works at [employer], has always been very active.  Patient states that a few days ago he woke up feeling like his left knee had been strained.  Patient states the knee feels very unstable, as if he does not trust it, patient states he is not fallen or had a give out on him yet.  Patient states he has pain on the medial aspect of his knee right at his knee joint.  Patient denies pain elsewhere.  Patient denies swelling in knee.  Patient states he has tried several knee braces, finally settled on one that is hinged.  Patient states not currently take any medication for pain.  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  Patient denies traumatic injury to the left knee in the past.  The history is provided by the patient.   Past Medical History:  Diagnosis Date   Chest pain    Dyspnea     There are no problems to display for this patient.   History reviewed. No pertinent surgical history.     Home Medications    Prior to Admission medications   Medication Sig Start Date End Date Taking? Authorizing Provider  naproxen (NAPROSYN) 375 MG tablet Take 1 tablet (375 mg total) by mouth 2 (two) times daily with a meal. 07/29/21 08/28/21 Yes Theadora Rama Scales, PA-C    Family History Family History  Problem Relation Age of Onset   Diabetes Mother    Cancer Mother    Heart disease Mother    Healthy Father     Social History Social History   Tobacco Use   Smoking status: Every Day    Packs/day: 1.00    Types: Cigarettes   Smokeless tobacco: Never  Vaping Use   Vaping Use: Never used  Substance Use Topics   Alcohol use: Not Currently   Drug use: Not Currently    Types: Marijuana     Allergies   Patient has no known  allergies.   Review of Systems Review of Systems Pertinent findings noted in history of present illness.    Physical Exam Triage Vital Signs ED Triage Vitals  Enc Vitals Group     BP      Pulse      Resp      Temp      Temp src      SpO2      Weight      Height      Head Circumference      Peak Flow      Pain Score      Pain Loc      Pain Edu?      Excl. in GC?    No data found.  Updated Vital Signs BP 124/79 (BP Location: Right Arm)   Pulse 71   Temp 98.2 F (36.8 C) (Oral)   Resp 18   Wt 195 lb 11.2 oz (88.8 kg)   SpO2 97%   BMI 25.13 kg/m   Visual Acuity Right Eye Distance:   Left Eye Distance:   Bilateral Distance:    Right Eye Near:   Left Eye Near:    Bilateral Near:  Physical Exam Vitals and nursing note reviewed.  Constitutional:      Appearance: Normal appearance.  HENT:     Head: Normocephalic and atraumatic.  Eyes:     Conjunctiva/sclera: Conjunctivae normal.  Cardiovascular:     Rate and Rhythm: Normal rate and regular rhythm.     Heart sounds: Normal heart sounds.  Pulmonary:     Effort: Pulmonary effort is normal.     Breath sounds: Normal breath sounds.  Abdominal:     General: Abdomen is flat. Bowel sounds are normal.     Palpations: Abdomen is soft.  Musculoskeletal:     Left knee: Crepitus (Mild) present. No swelling, deformity, effusion, erythema, ecchymosis or bony tenderness. Decreased range of motion (Patient demonstrates decreased active extension, passive extension is normal). Tenderness (Medial meniscus with deep palpation) present over the medial joint line. No lateral joint line, MCL, LCL, ACL, PCL or patellar tendon tenderness. No LCL laxity, MCL laxity, ACL laxity or PCL laxity.Normal alignment and normal patellar mobility. Normal pulse.     Instability Tests: Anterior drawer test negative. Posterior drawer test negative. Anterior Lachman test negative. Medial McMurray test positive. Lateral McMurray test negative.   Skin:    General: Skin is warm and dry.  Neurological:     General: No focal deficit present.     Mental Status: He is alert and oriented to person, place, and time.  Psychiatric:        Mood and Affect: Mood normal.        Behavior: Behavior normal.     UC Treatments / Results  Labs (all labs ordered are listed, but only abnormal results are displayed) Labs Reviewed - No data to display  EKG   Radiology No results found.  Procedures Procedures (including critical care time)  Medications Ordered in UC Medications - No data to display  Initial Impression / Assessment and Plan / UC Course  I have reviewed the triage vital signs and the nursing notes.  Pertinent labs & imaging results that were available during my care of the patient were reviewed by me and considered in my medical decision making (see chart for details).     I suspect patient has torn his meniscus or has has a meniscal tear due to overuse when working.  Patient advised to continue wearing brace, have x-ray done as ordered, began naproxen and ice whenever able to reduce inflammation.  I also provided patient with the names of 2 orthopedic providers that he can reach out to should he has some abnormal findings on his x-rays and require follow-up.  I also provided patient with a note for work.  Patient verbalized understanding and agreement of plan as discussed.  All questions were addressed during visit.  Please see discharge instructions below for further details of plan.  Final Clinical Impressions(s) / UC Diagnoses   Final diagnoses:  Left knee injury, initial encounter     Discharge Instructions      Please have x-ray of her left knee performed as ordered, the location has been provided to you within that this document.  In the meantime, for your pain, recommend that you take naproxen 375 mg twice daily and for stability continue to wear your brace.  I provided you with the names of a few orthopedic  providers that you can contact if your left knee x-ray reveals any abnormalities.  We will provide you with the results of her x-ray once they are received.     ED Prescriptions  Medication Sig Dispense Auth. Provider   naproxen (NAPROSYN) 375 MG tablet Take 1 tablet (375 mg total) by mouth 2 (two) times daily with a meal. 60 tablet Theadora Rama Scales, PA-C      PDMP not reviewed this encounter.   Theadora Rama Scales, PA-C 07/29/21 1052

## 2021-07-29 NOTE — ED Triage Notes (Addendum)
Patient reports having discomfort to bilateral knees, patient states right feels like a strain (when pressure is applied), left knee feels like "something is rocking".   Patient states he has tried knee braces and hinged knee braces at home.

## 2021-07-30 ENCOUNTER — Telehealth (HOSPITAL_COMMUNITY): Payer: Self-pay | Admitting: Emergency Medicine

## 2021-07-30 NOTE — Telephone Encounter (Signed)
Reaching out to patient to offer assistance regarding upcoming cardiac imaging study; pt verbalizes understanding of appt date/time, parking situation and where to check in, pre-test NPO status and medications ordered, and verified current allergies; name and call back number provided for further questions should they arise Rockwell Alexandria RN Navigator Cardiac Imaging [facility] [phone] office [phone] cell  Reminder call to get CBC prior to CMR Denies claustro Denies implants/metal Denies iv issues

## 2021-08-03 ENCOUNTER — Other Ambulatory Visit: Payer: Self-pay

## 2021-08-03 DIAGNOSIS — R079 Chest pain, unspecified: Secondary | ICD-10-CM

## 2021-08-03 LAB — CBC
Hematocrit: 43.5 % (ref 37.5–51.0)
Hemoglobin: 15 g/dL (ref 13.0–17.7)
MCH: 33.5 pg — ABNORMAL HIGH (ref 26.6–33.0)
MCHC: 34.5 g/dL (ref 31.5–35.7)
MCV: 97 fL (ref 79–97)
Platelets: 293 10*3/uL (ref 150–450)
RBC: 4.48 x10E6/uL (ref 4.14–5.80)
RDW: 12 % (ref 11.6–15.4)
WBC: 8.4 10*3/uL (ref 3.4–10.8)

## 2021-08-04 ENCOUNTER — Ambulatory Visit (HOSPITAL_COMMUNITY)
Admission: RE | Admit: 2021-08-04 | Discharge: 2021-08-04 | Disposition: A | Discharge status: Home / Self Care | Payer: Self-pay | Source: Ambulatory Visit | Attending: Cardiology | Admitting: Cardiology

## 2021-08-04 DIAGNOSIS — R079 Chest pain, unspecified: Secondary | ICD-10-CM

## 2021-08-04 IMAGING — MR MR CARD MORPHOLOGY WO/W CM
45 of 48 series · 45 of 48 positions shown · IV contrast (gadavist)
Comparison: none

EXAM:
CARDIAC MRI
TECHNIQUE: The patient was scanned on a 1.5 Tesla GE magnet. A dedicated
cardiac coil was used. Functional imaging was done using Fiesta
sequences. [DATE], and 4 chamber views were done to assess for RWMA's.
Modified ARAYA rule using a short axis stack was used to
calculate an ejection fraction on a dedicated work station using
Circle software. The patient received 8 cc of Gadavist. After 10
minutes inversion recovery sequences were used to assess for
infiltration and scar tissue.

CONTRAST:  8 cc Gadavist

[Series 4: t2_haste_db_tra_bh · axial · 8.0mm · 1.41mm/px · 1 of 16 slices shown]
[im 1/16]
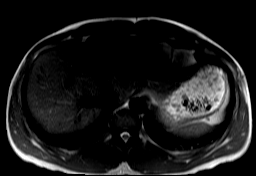

[Series 8: bSSFP · oblique · 8.0mm · 1.61mm/px · 1 of 25 slices shown (1 of 23)]
[im 1/25]
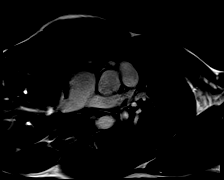

[Series 9: bSSFP · oblique · 8.0mm · 1.61mm/px · 1 of 25 slices shown (2 of 23)]
[im 1/25]
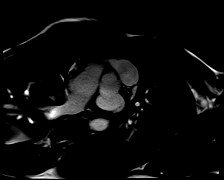

[Series 10: bSSFP · oblique · 8.0mm · 1.61mm/px · 1 of 25 slices shown (3 of 23)]
[im 1/25]
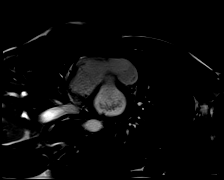

[Series 11: bSSFP · oblique · 8.0mm · 1.61mm/px · 1 of 25 slices shown (4 of 23)]
[im 1/25]
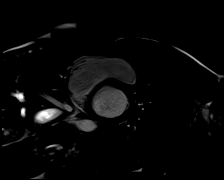

[Series 12: bSSFP · oblique · 8.0mm · 1.61mm/px · 1 of 25 slices shown (5 of 23)]
[im 1/25]
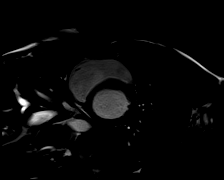

[Series 13: bSSFP · oblique · 8.0mm · 1.61mm/px · 1 of 25 slices shown (6 of 23)]
[im 1/25]
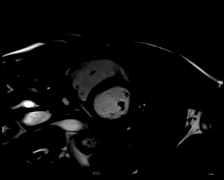

[Series 14: bSSFP · oblique · 8.0mm · 1.61mm/px · 1 of 25 slices shown (7 of 23)]
[im 1/25]
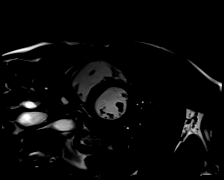

[Series 15: bSSFP · oblique · 8.0mm · 1.61mm/px · 1 of 25 slices shown (8 of 23)]
[im 1/25]
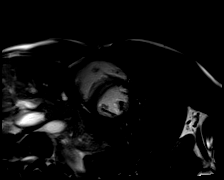

[Series 16: bSSFP · oblique · 8.0mm · 1.61mm/px · 1 of 25 slices shown (9 of 23)]
[im 1/25]
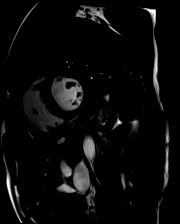

[Series 17: bSSFP · oblique · 8.0mm · 1.61mm/px · 1 of 25 slices shown (10 of 23)]
[im 1/25]
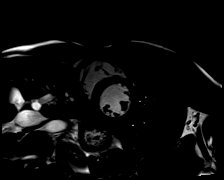

[Series 18: bSSFP · oblique · 8.0mm · 1.61mm/px · 1 of 25 slices shown (11 of 23)]
[im 1/25]
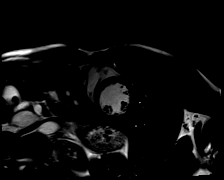

[Series 19: bSSFP · oblique · 8.0mm · 1.61mm/px · 1 of 25 slices shown (12 of 23)]
[im 1/25]
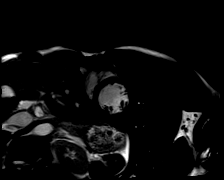

[Series 20: bSSFP · oblique · 8.0mm · 1.61mm/px · 1 of 25 slices shown (13 of 23)]
[im 1/25]
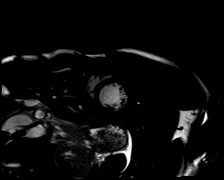

[Series 21: bSSFP · oblique · 8.0mm · 1.61mm/px · 1 of 25 slices shown (14 of 23)]
[im 1/25]
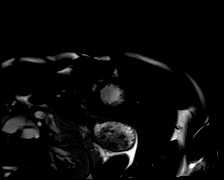

[Series 22: bSSFP · oblique · 8.0mm · 1.61mm/px · 1 of 25 slices shown (15 of 23)]
[im 1/25]
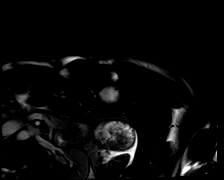

[Series 23: bSSFP · oblique · 8.0mm · 1.61mm/px · 1 of 25 slices shown (16 of 23)]
[im 1/25]
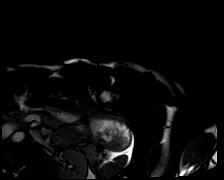

[Series 24: bSSFP · oblique · 8.0mm · 1.61mm/px · 1 of 25 slices shown (17 of 23)]
[im 1/25]
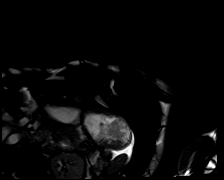

[Series 25: bSSFP · oblique · 8.0mm · 1.61mm/px · 1 of 25 slices shown (18 of 23)]
[im 1/25]
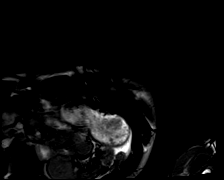

[Series 26: (id)_long_t1 · oblique · 8.0mm · 1.56mm/px · 1 of 24 slices shown]
[im 1/24]
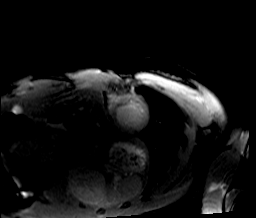

[Series 27: (id)_long_t1_moco · oblique · 8.0mm · 1.56mm/px · 1 of 24 slices shown]
[im 1/24]
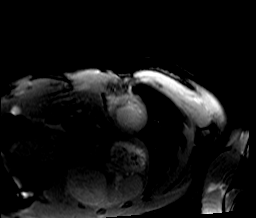

[Series 28: (id)_long_t1_moco_t1 · oblique · 8.0mm · 1.56mm/px · 1 of 3 slices shown (1 of 2)]
[im 1/3]
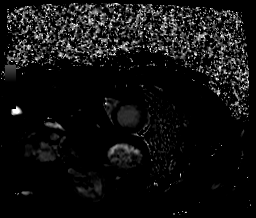

[Series 28: (id)_long_t1_moco_t1 · 1 of 3 slices shown (2 of 2)]
[im 1/3]
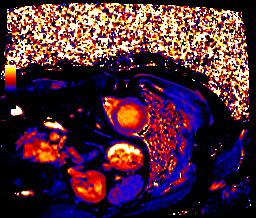

[Series 30: (id)_trufi · oblique · 8.0mm · 2.08mm/px · 1 of 9 slices shown]
[im 1/9]
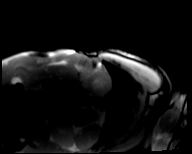

[Series 31: (id)_trufi_moco · oblique · 8.0mm · 2.08mm/px · 1 of 9 slices shown]
[im 1/9]
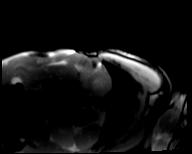

[Series 32: (id)_trufi_moco_t2 · oblique · 8.0mm · 2.08mm/px · 1 of 3 slices shown]
[im 1/3]
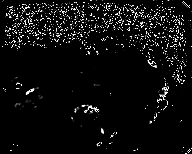

[Series 34: bSSFP · oblique · 6.0mm · 1.41mm/px · 1 of 25 slices shown (19 of 23)]
[im 1/25]
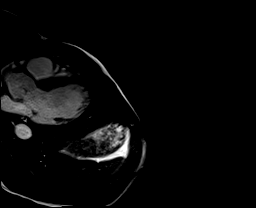

[Series 35: bSSFP · oblique · 6.0mm · 1.41mm/px · 1 of 25 slices shown (20 of 23)]
[im 1/25]
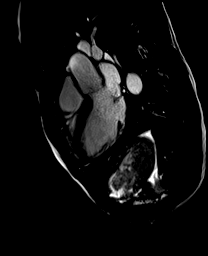

[Series 36: bSSFP · oblique · 6.0mm · 1.41mm/px · 1 of 25 slices shown (21 of 23)]
[im 1/25]
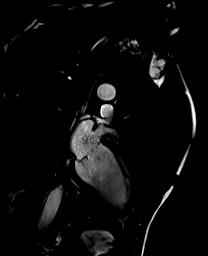

[Series 37: bSSFP · oblique · 6.0mm · 1.41mm/px · 1 of 25 slices shown (22 of 23)]
[im 1/25]
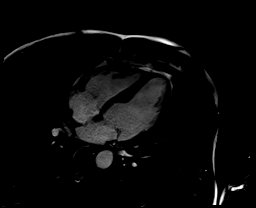

[Series 38: pre short axis · oblique · non-contrast · 8.0mm · 2.25mm/px · 1 of 10 slices shown (1 of 6)]
[im 1/10]
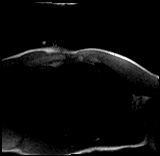

[Series 39: pre short axis · oblique · non-contrast · 8.0mm · 2.25mm/px · 1 of 10 slices shown (2 of 6)]
[im 1/10]
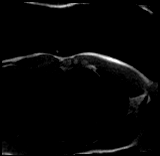

[Series 40: pre short axis · oblique · non-contrast · 8.0mm · 2.25mm/px · 1 of 10 slices shown (3 of 6)]
[im 1/10]
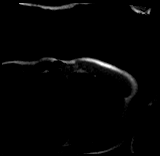

[Series 41: pre short axis · oblique · non-contrast · 8.0mm · 2.25mm/px · 1 of 10 slices shown (4 of 6)]
[im 1/10]
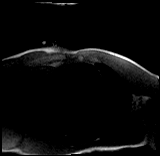

[Series 42: pre short axis · oblique · non-contrast · 8.0mm · 2.25mm/px · 1 of 10 slices shown (5 of 6)]
[im 1/10]
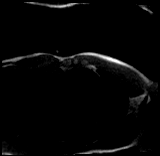

[Series 43: pre short axis · oblique · non-contrast · 8.0mm · 2.25mm/px · 1 of 10 slices shown (6 of 6)]
[im 1/10]
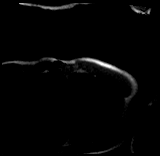

[Series 44: rest short axis · oblique · 8.0mm · 2.25mm/px · 1 of 60 slices shown (1 of 6)]
[im 1/60]
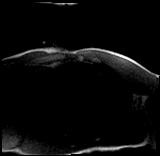

[Series 45: rest short axis · oblique · 8.0mm · 2.25mm/px · 1 of 60 slices shown (2 of 6)]
[im 1/60]
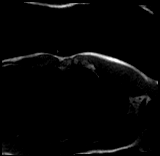

[Series 46: rest short axis · oblique · 8.0mm · 2.25mm/px · 1 of 60 slices shown (3 of 6)]
[im 1/60]
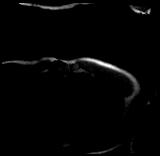

[Series 47: rest short axis · oblique · 8.0mm · 2.25mm/px · 1 of 60 slices shown (4 of 6)]
[im 1/60]
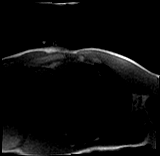

[Series 48: rest short axis · oblique · 8.0mm · 2.25mm/px · 1 of 60 slices shown (5 of 6)]
[im 1/60]
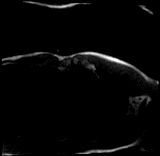

[Series 49: rest short axis · oblique · 8.0mm · 2.25mm/px · 1 of 60 slices shown (6 of 6)]
[im 1/60]
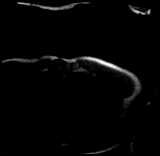

[Series 50: bSSFP · coronal · 6.0mm · 1.41mm/px · 1 of 25 slices shown (23 of 23)]
[im 1/25]
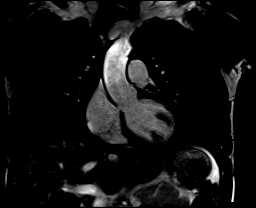

[Series 51: aortic valve cine · axial · 6.0mm · 1.41mm/px · 1 of 25 slices shown]
[im 1/25]
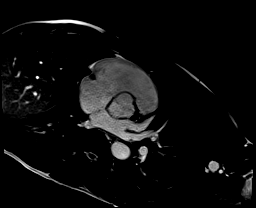

[Series 53: lge_single shot sa · oblique · 8.0mm · 2.08mm/px · 1 of 18 slices shown]
[im 1/18]
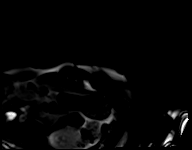

[45 of 48 positions shown; findings below may reference images not displayed]

FINDINGS: Limited images of the lung fields showed no gross abnormalities.

Mildly dilated aortic root (3.9 cm). Normal left ventricular size
with mild focal basal septal hypertrophy. No turbulence in the LV
outflow tract and no mitral valve systolic anterior motion. Normal
wall motion, LV EF 54%. Normal right ventricular size and systolic
function, EF 45%. Normal left and right atrial sizes. Trileaflet
aortic valve with no stenosis or regurgitation. No significant
mitral regurgitation.

On delayed enhancement imaging, there was no myocardial late
gadolinium enhancement (LGE).

MEASUREMENTS:
MEASUREMENTS
LVEDV 194 mL

LVSV 105 mL
LVEF 54%

RVEDV 240 mL
RVSV 107 mL
RVEF 45%

ECV 32% in the septum
IMPRESSION: 1. Low normal LV systolic function, EF 54%. Mild focal basal septal
hypertrophy.

2.  Normal RV size and systolic function, EF 45%.

3. No myocardial LGE, so no definite evidence for prior MI,
infiltrative disease, or myocarditis.

This study does not have definite features of hypertrophic
cardiomyopathy.

ARAYA

## 2021-08-04 MED ORDER — GADOBUTROL 1 MMOL/ML IV SOLN
8.0000 mL | Freq: Once | INTRAVENOUS | Status: AC | PRN
  Administered 2021-08-04: 8 mL via INTRAVENOUS

## 2021-08-12 ENCOUNTER — Telehealth: Payer: Self-pay | Admitting: Cardiology

## 2021-08-12 NOTE — Telephone Encounter (Signed)
Ricardo Reichert, MD  08/04/2021 11:15 PM EDT     Normal LV and RV size and function with no HOCM  The patient has been notified of the result and verbalized understanding.  All questions (if any) were answered. Theresia Majors, RN 08/12/2021 3:56 PM

## 2021-08-12 NOTE — Telephone Encounter (Signed)
[facility] is returning Ricardo Wright's call in regards to his results.

## 2021-10-10 ENCOUNTER — Emergency Department (HOSPITAL_COMMUNITY): Payer: Self-pay

## 2021-10-10 ENCOUNTER — Emergency Department (HOSPITAL_COMMUNITY)
Admission: EM | Admit: 2021-10-10 | Discharge: 2021-10-10 | Disposition: A | Discharge status: Home / Self Care | Payer: BC Managed Care – PPO | Attending: Emergency Medicine | Admitting: Emergency Medicine

## 2021-10-10 ENCOUNTER — Encounter (HOSPITAL_COMMUNITY): Payer: Self-pay | Admitting: Emergency Medicine

## 2021-10-10 ENCOUNTER — Other Ambulatory Visit: Payer: Self-pay

## 2021-10-10 DIAGNOSIS — R197 Diarrhea, unspecified: Secondary | ICD-10-CM | POA: Insufficient documentation

## 2021-10-10 DIAGNOSIS — R200 Anesthesia of skin: Secondary | ICD-10-CM | POA: Diagnosis not present

## 2021-10-10 DIAGNOSIS — G253 Myoclonus: Secondary | ICD-10-CM

## 2021-10-10 DIAGNOSIS — R29898 Other symptoms and signs involving the musculoskeletal system: Secondary | ICD-10-CM

## 2021-10-10 DIAGNOSIS — M5136 Other intervertebral disc degeneration, lumbar region: Secondary | ICD-10-CM | POA: Diagnosis not present

## 2021-10-10 DIAGNOSIS — R112 Nausea with vomiting, unspecified: Secondary | ICD-10-CM | POA: Insufficient documentation

## 2021-10-10 DIAGNOSIS — R531 Weakness: Secondary | ICD-10-CM | POA: Insufficient documentation

## 2021-10-10 DIAGNOSIS — M545 Low back pain, unspecified: Secondary | ICD-10-CM | POA: Diagnosis not present

## 2021-10-10 DIAGNOSIS — F17210 Nicotine dependence, cigarettes, uncomplicated: Secondary | ICD-10-CM | POA: Insufficient documentation

## 2021-10-10 DIAGNOSIS — M25562 Pain in left knee: Secondary | ICD-10-CM | POA: Insufficient documentation

## 2021-10-10 LAB — CBC WITH DIFFERENTIAL/PLATELET
Abs Immature Granulocytes: 0.04 10*3/uL (ref 0.00–0.07)
Basophils Absolute: 0 10*3/uL (ref 0.0–0.1)
Basophils Relative: 0 %
Eosinophils Absolute: 0.1 10*3/uL (ref 0.0–0.5)
Eosinophils Relative: 1 %
HCT: 47.6 % (ref 39.0–52.0)
Hemoglobin: 16.1 g/dL (ref 13.0–17.0)
Immature Granulocytes: 0 %
Lymphocytes Relative: 5 %
Lymphs Abs: 0.6 10*3/uL — ABNORMAL LOW (ref 0.7–4.0)
MCH: 33.3 pg (ref 26.0–34.0)
MCHC: 33.8 g/dL (ref 30.0–36.0)
MCV: 98.3 fL (ref 80.0–100.0)
Monocytes Absolute: 1.1 10*3/uL — ABNORMAL HIGH (ref 0.1–1.0)
Monocytes Relative: 10 %
Neutro Abs: 8.6 10*3/uL — ABNORMAL HIGH (ref 1.7–7.7)
Neutrophils Relative %: 84 %
Platelets: 297 10*3/uL (ref 150–400)
RBC: 4.84 MIL/uL (ref 4.22–5.81)
RDW: 11.2 % — ABNORMAL LOW (ref 11.5–15.5)
WBC: 10.3 10*3/uL (ref 4.0–10.5)
nRBC: 0 % (ref 0.0–0.2)

## 2021-10-10 LAB — COMPREHENSIVE METABOLIC PANEL
ALT: 15 U/L (ref 0–44)
AST: 23 U/L (ref 15–41)
Albumin: 4.1 g/dL (ref 3.5–5.0)
Alkaline Phosphatase: 56 U/L (ref 38–126)
Anion gap: 9 (ref 5–15)
BUN: 15 mg/dL (ref 6–20)
CO2: 23 mmol/L (ref 22–32)
Calcium: 9.1 mg/dL (ref 8.9–10.3)
Chloride: 104 mmol/L (ref 98–111)
Creatinine, Ser: 1.36 mg/dL — ABNORMAL HIGH (ref 0.61–1.24)
GFR, Estimated: >60 mL/min (ref >60)
Glucose, Bld: 121 mg/dL — ABNORMAL HIGH (ref 70–99)
Potassium: 3.5 mmol/L (ref 3.5–5.1)
Sodium: 136 mmol/L (ref 135–145)
Total Bilirubin: 1 mg/dL (ref 0.3–1.2)
Total Protein: 7.1 g/dL (ref 6.5–8.1)

## 2021-10-10 LAB — LIPASE, BLOOD: Lipase: 31 U/L (ref 11–51)

## 2021-10-10 IMAGING — MR MR THORACIC SPINE WO/W CM
14 of 22 series · 22 of 48 positions shown · IV contrast (gadavist)
Comparison: Lumbar study same day

CLINICAL DATA: Chronic myelopathy.  Thoracic region pain.

EXAM:
MRI THORACIC WITHOUT AND WITH CONTRAST
TECHNIQUE: Multiplanar and multiecho pulse sequences of the thoracic spine were
obtained without and with intravenous contrast.
CONTRAST:  9mL GADAVIST GADOBUTROL 1 MMOL/ML IV SOLN

[Series 12: T1 · sagittal · 5.0mm · 1.51mm/px · 1 of 11 slices shown (1 of 8)]
[im 1/11]
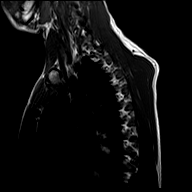

[Series 13: T1 · sagittal · 5.0mm · 1.23mm/px · 1 of 14 slices shown (2 of 8)]
[im 1/14]
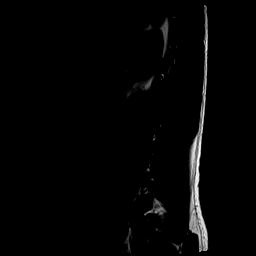

[Series 14: T1 · sagittal · 6.0mm · 1.23mm/px · 1 of 10 slices shown (3 of 8)]
[im 1/10]
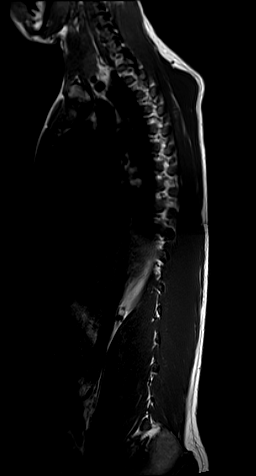

[Series 15: T2 · sagittal · 3.0mm · 0.78mm/px · 1 of 22 slices shown (1 of 2)]
[im 1/22]
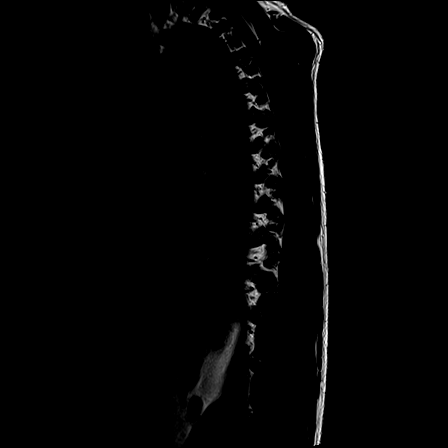

[Series 16: T1 · sagittal · 3.0mm · 0.78mm/px · 1 of 22 slices shown (4 of 8)]
[im 1/22]
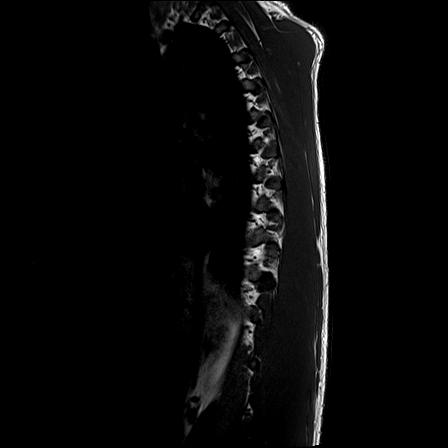

[Series 17: STIR · sagittal · 3.0mm · 0.39mm/px · 1 of 22 slices shown]
[im 1/22]
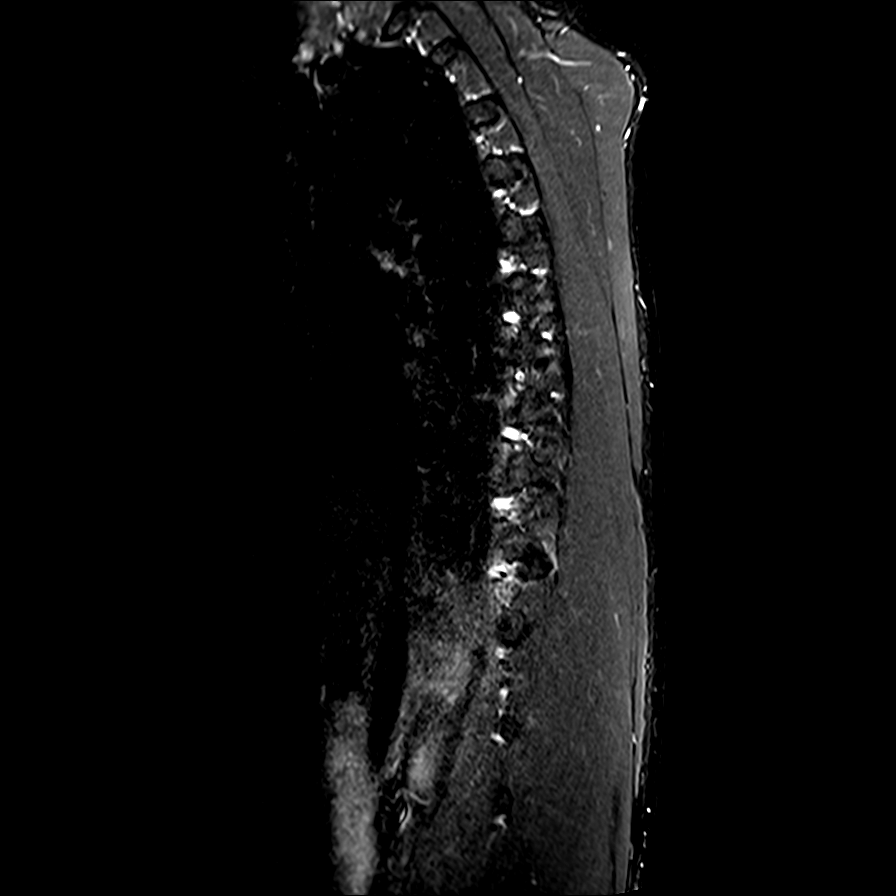

[Series 18: T2 · axial · 4.0mm · 0.59mm/px · z∈[-190,+164]mm · 3 of 60 slices shown (2 of 2)]
[im 1/60]
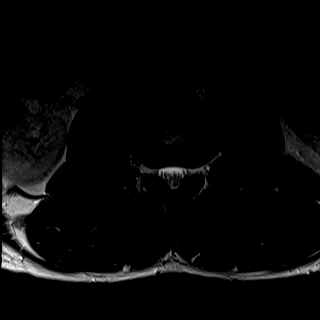
[im 30/60]
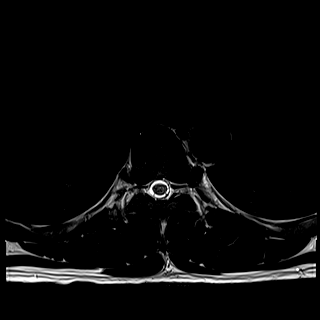
[im 60/60]
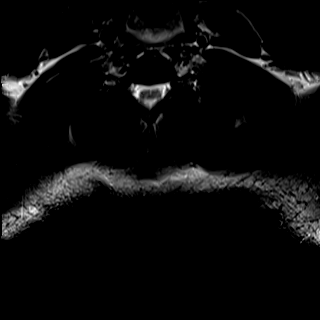

[Series 19: GRE · axial · 4.0mm · 0.37mm/px · 1 of 60 slices shown]
[im 1/60]
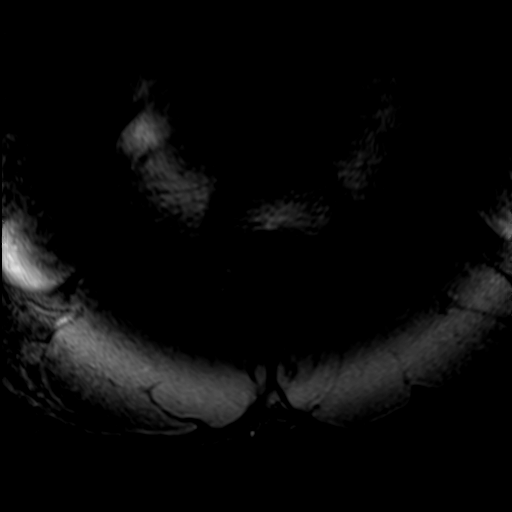

[Series 20: T1 · axial · non-contrast · 4.0mm · 0.31mm/px · z∈[-190,+164]mm · 4 of 60 slices shown (5 of 8)]
[im 1/60]
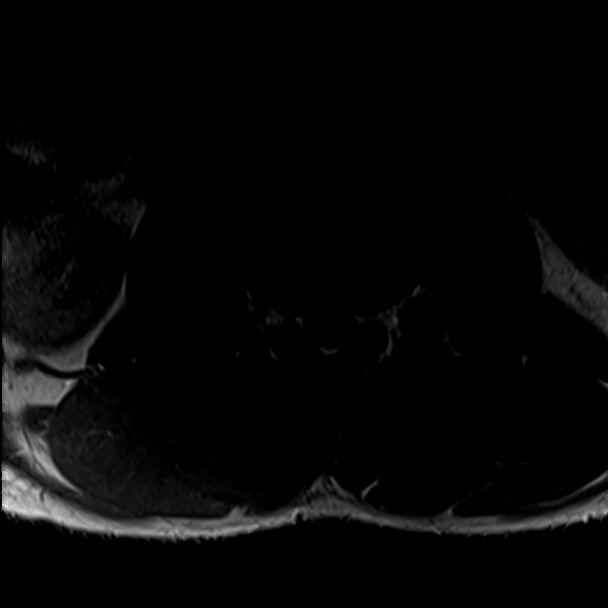
[im 20/60]
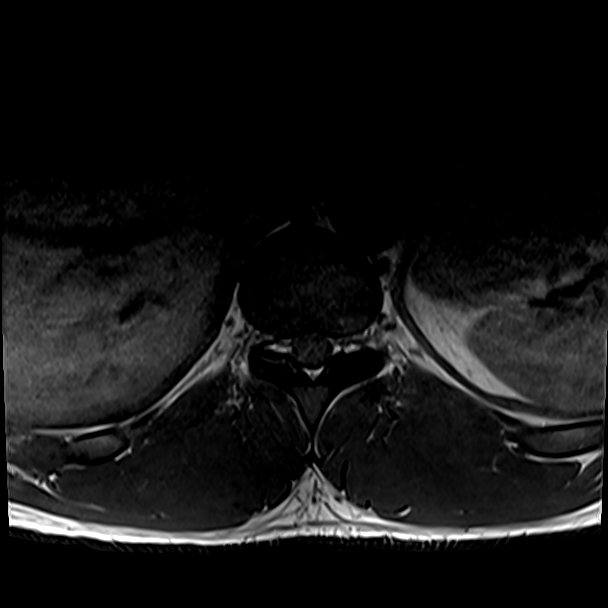
[im 40/60]
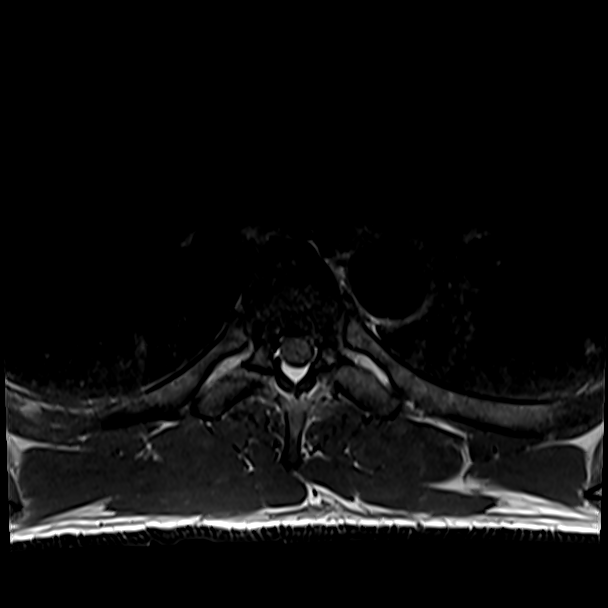
[im 60/60]
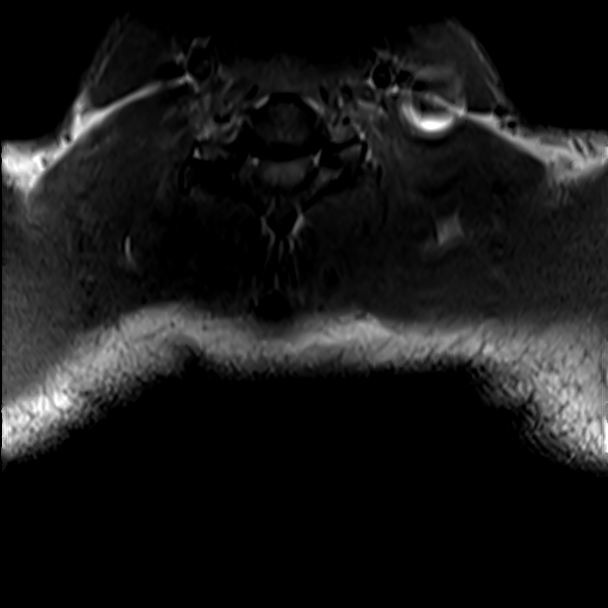

[Series 21: T1 · sagittal · 5.0mm · 1.67mm/px · 1 of 11 slices shown (6 of 8)]
[im 1/11]
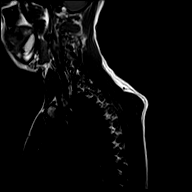

[Series 22: T1 · sagittal · 5.0mm · 1.25mm/px · 1 of 14 slices shown (7 of 8)]
[im 1/14]
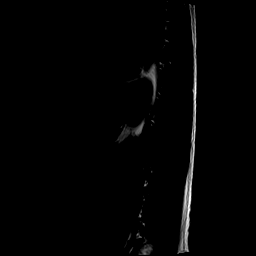

[Series 23: T1 · sagittal · 6.0mm · 1.25mm/px · 1 of 10 slices shown (8 of 8)]
[im 1/10]
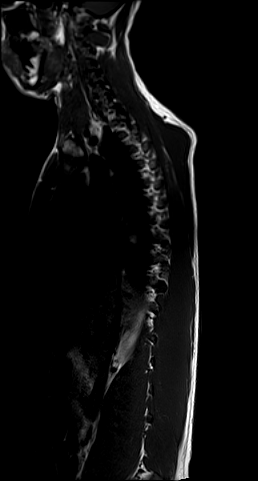

[Series 24: T1 post-contrast · axial · 4.0mm · 0.31mm/px · z∈[-190,+164]mm · 4 of 60 slices shown]
[im 1/60]
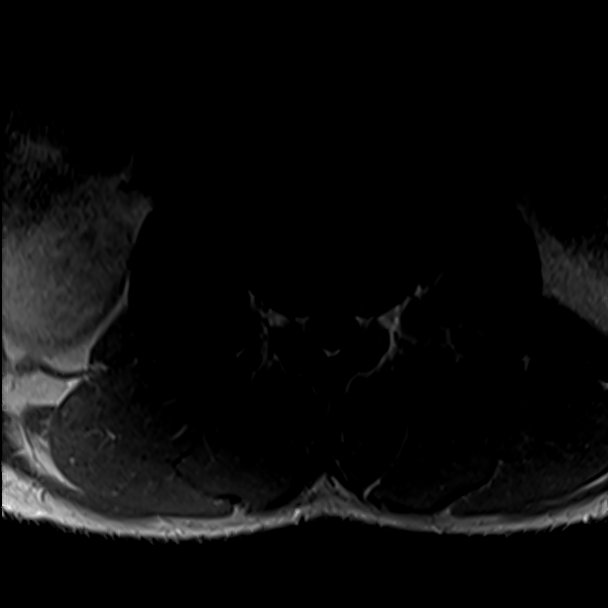
[im 20/60]
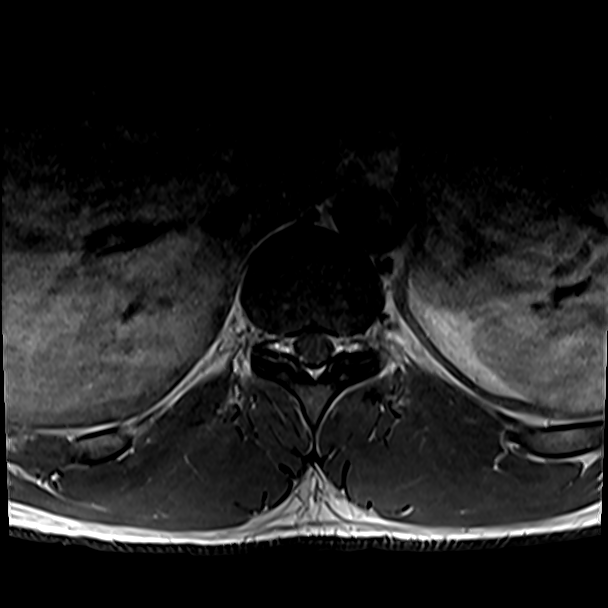
[im 40/60]
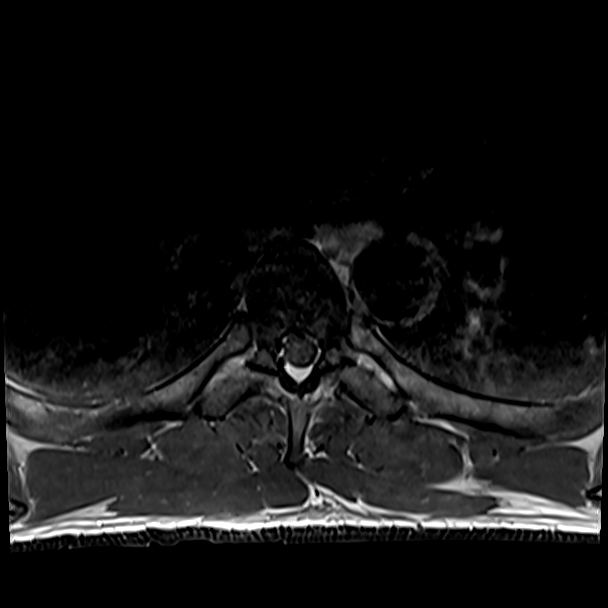
[im 60/60]
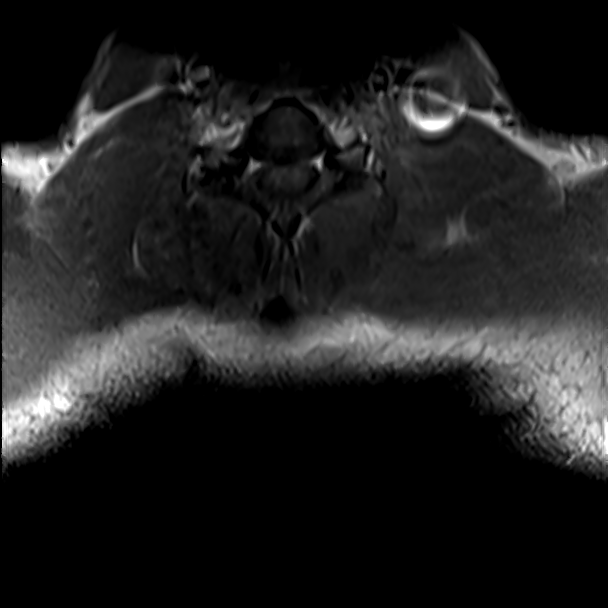

[Series 25: T1 fat-sat post-contrast · sagittal · 3.0mm · 0.78mm/px · 1 of 22 slices shown]
[im 1/22]
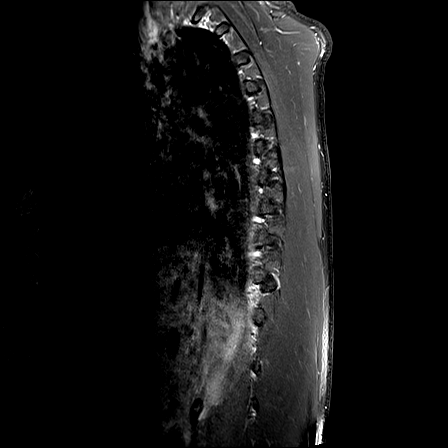

[22 of 48 positions shown; findings below may reference images not displayed]

FINDINGS: Alignment:  Normal

Vertebrae: No fracture or focal bone lesion.

Cord:  No cord compression or focal cord lesion.

Paraspinal and other soft tissues: Normal

Disc levels:

The discs are normal with exception of mild bulging of the disc. No
compressive stenosis. All other levels appear normal.
IMPRESSION: Normal thoracic MRI, with exception of a minimal non-compressive
disc bulge at T6-7.

## 2021-10-10 IMAGING — MR MR LUMBAR SPINE W/O CM
5 of 8 series · 21 of 48 positions shown · non-contrast
Comparison: Report of lumbar radiographs [DATE] (no images
available).

CLINICAL DATA: 40-year-old male with vomiting, diarrhea, chronic
left knee pain since [REDACTED] and now with resting tremor in left
lower extremity.

EXAM:
MRI LUMBAR SPINE WITHOUT CONTRAST
TECHNIQUE: Multiplanar, multisequence MR imaging of the lumbar spine was
performed. No intravenous contrast was administered.

[Series 5: T2 · sagittal · 4.0mm · 0.73mm/px · 3 of 16 slices shown (1 of 2)]
[im 1/16]
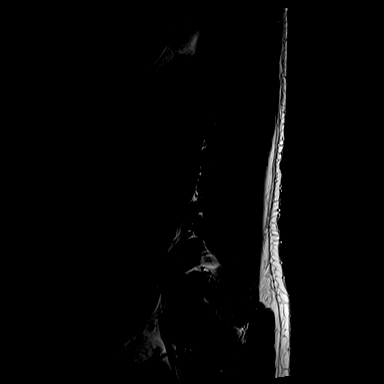
[im 8/16]
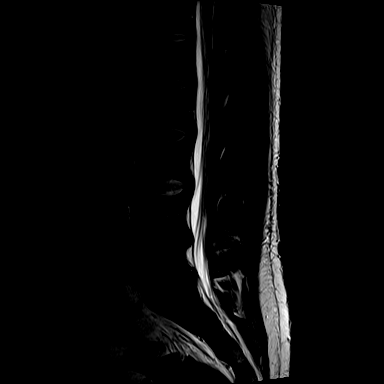
[im 16/16]
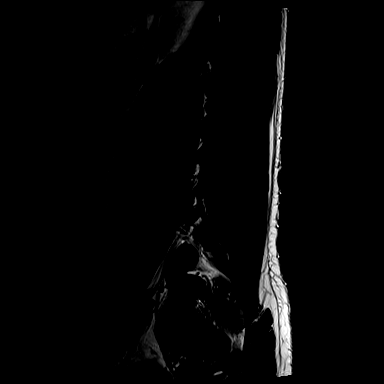

[Series 6: STIR · sagittal · 4.0mm · 0.55mm/px · 1 of 16 slices shown]
[im 1/16]
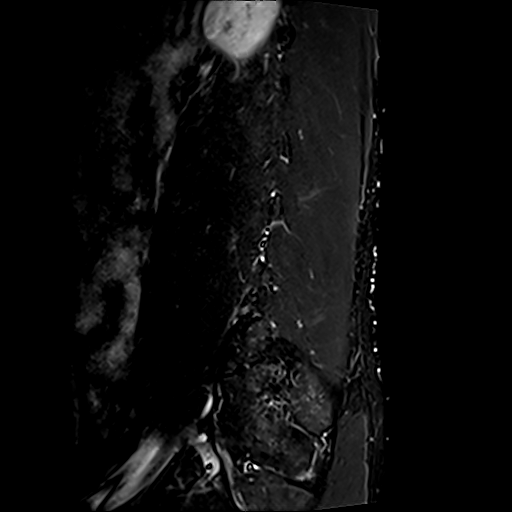

[Series 7: T1 · sagittal · 4.0mm · 0.88mm/px · 3 of 16 slices shown (1 of 2)]
[im 1/16]
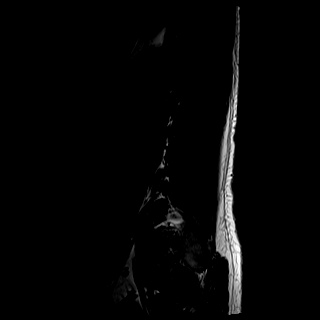
[im 8/16]
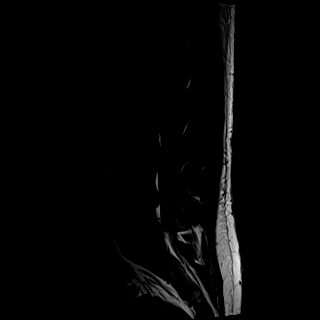
[im 16/16]
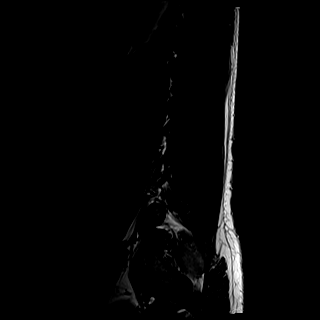

[Series 8: T2 · axial · 4.0mm · 0.57mm/px · z∈[-92,+127]mm · 7 of 40 slices shown (2 of 2)]
[im 1/40]
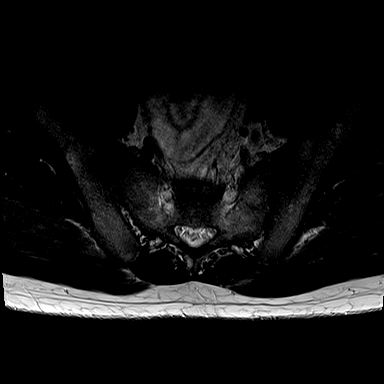
[im 7/40]
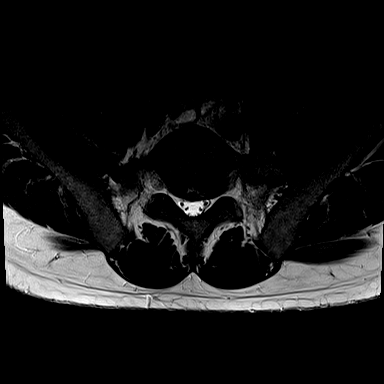
[im 14/40]
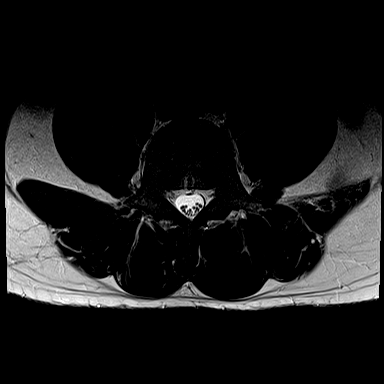
[im 20/40]
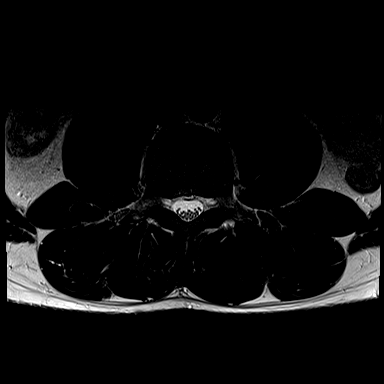
[im 27/40]
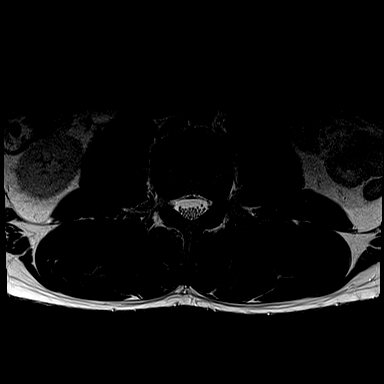
[im 33/40]
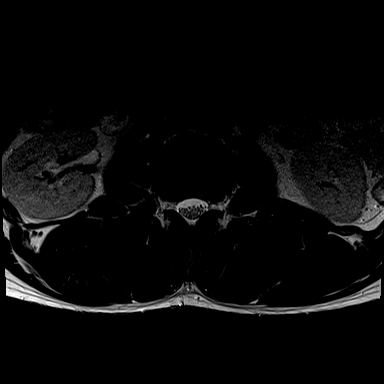
[im 40/40]
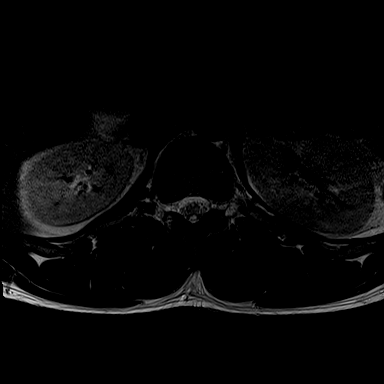

[Series 9: T1 · axial · 4.0mm · 0.34mm/px · z∈[-92,+127]mm · 7 of 40 slices shown (2 of 2)]
[im 1/40]
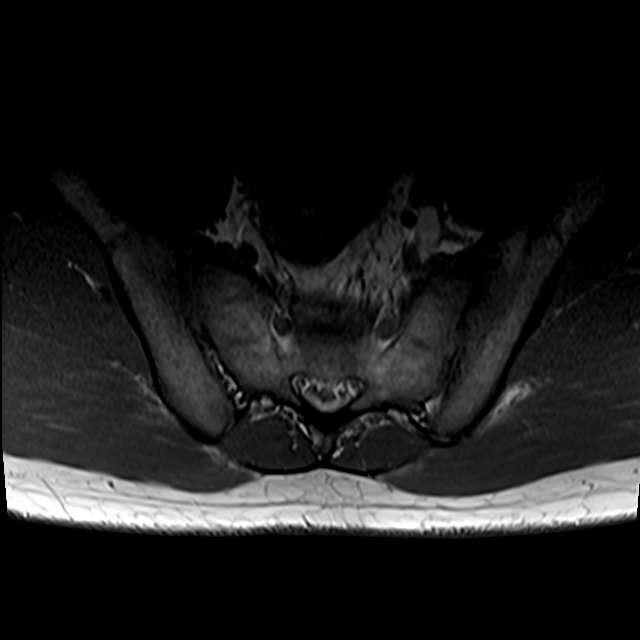
[im 7/40]
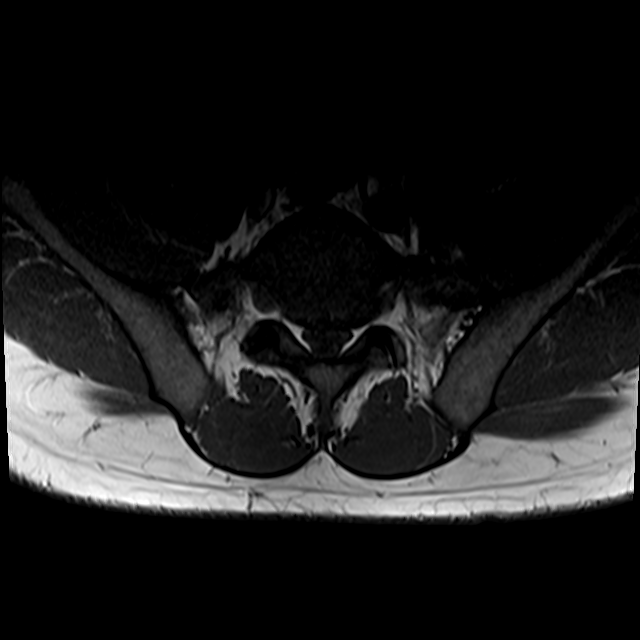
[im 14/40]
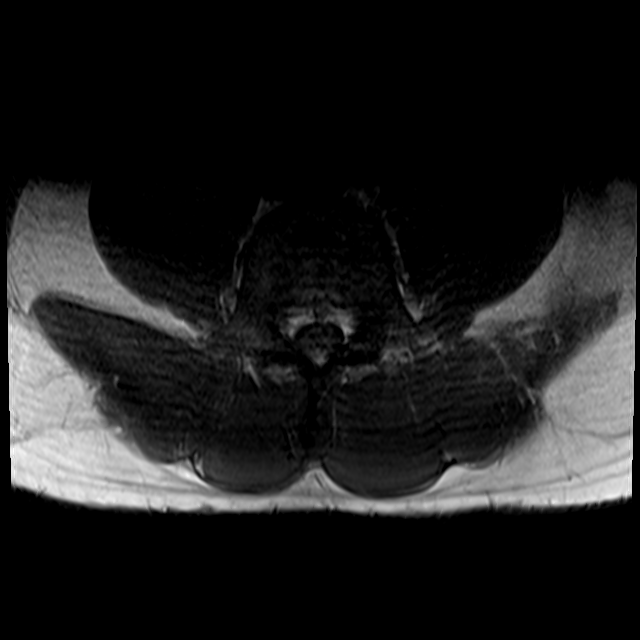
[im 20/40]
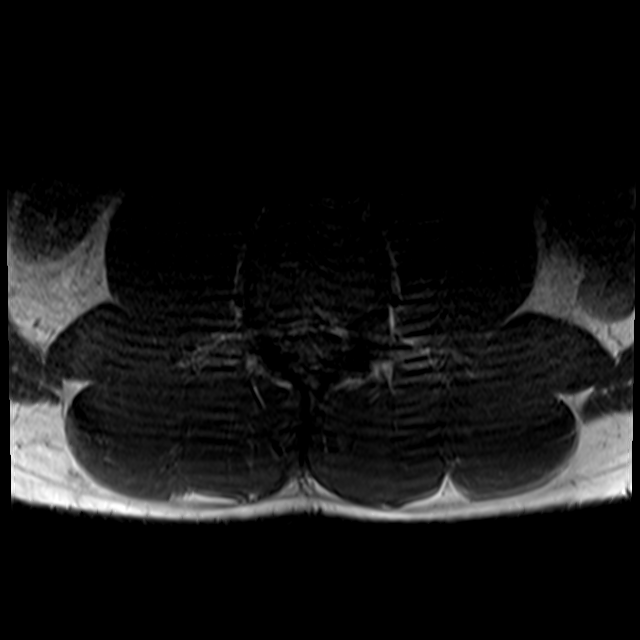
[im 27/40]
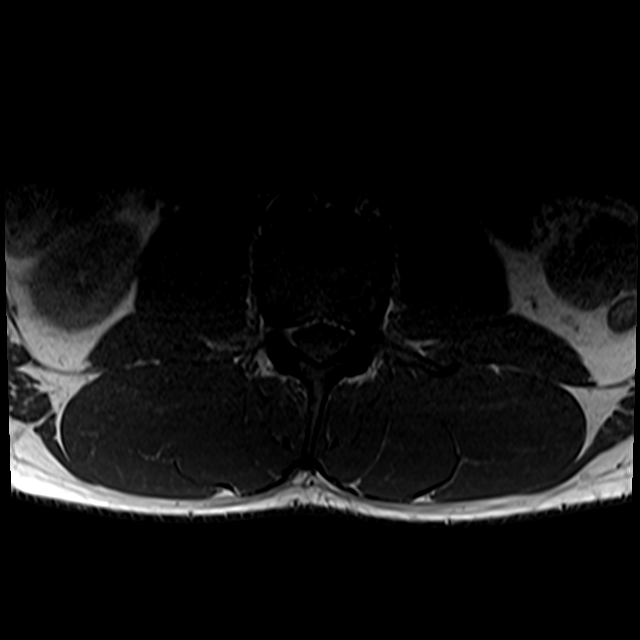
[im 33/40]
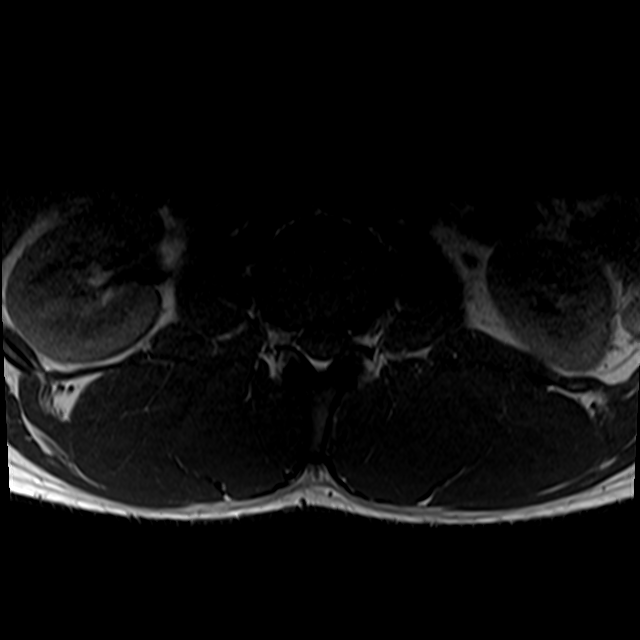
[im 40/40]
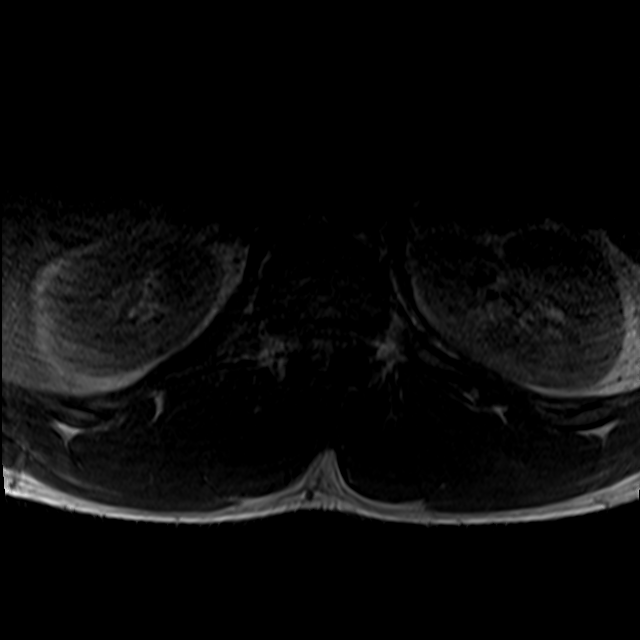

[21 of 48 positions shown; findings below may reference images not displayed]

FINDINGS: Segmentation: Assuming full size ribs at T12 Lumbar segmentation is
designated as normal, but with hypoplastic L5-S1 disc and bilateral
L5-S1 assimilation joints. Correlation with radiographs is
recommended prior to any operative intervention.

Alignment:  Straightening of lumbar lordosis.  No spondylolisthesis.

Vertebrae: No marrow edema or evidence of acute osseous abnormality.
Visualized bone marrow signal is within normal limits. Intact
visible sacrum.

Conus medullaris and cauda equina: Conus extends to the T12-L1
level. Conus appears normal. Above L4-L5 the cauda equina nerve
roots appear normal.

Paraspinal and other soft tissues: Negative.

Disc levels:

Somewhat congenital short pedicle distance in the lumbar spine. The
following superimposed degenerative changes are noted.

T12-L1:  Negative.

L1-L2:  Negative.

L2-L3:  Subtle disc bulging.  No significant stenosis.

L3-L4:  Subtle disc bulging.  No significant stenosis.

L4-L5: Disc desiccation and mild disc space loss. Circumferential
disc bulge with superimposed broad-based rightward and slightly
caudal disc protrusion (series 5, image 7 and series 8, image 30).
Up to moderate associated right lateral recess stenosis (right L5
nerve level). Borderline to mild spinal stenosis. Up to mild but
symmetric bilateral L4 neural foraminal stenosis.

L5-S1: Mild disc desiccation. Vestigial disc with mild endplate
spurring. No stenosis.
IMPRESSION: 1. Mildly transitional lumbosacral anatomy. Hypoplastic L5-S1 disc.
Correlation with radiographs is recommended prior to any operative
intervention.

2. Rightward lumbar disc degeneration at L4-L5. Involvement of the
right L5 nerve roots in the lateral recess, and up to mild spinal
stenosis, but no significant left side neural impingement. Minimal
lumbar spine degeneration otherwise.

## 2021-10-10 MED ORDER — SODIUM CHLORIDE 0.9 % IV BOLUS
1000.0000 mL | Freq: Once | INTRAVENOUS | Status: AC
  Administered 2021-10-10: 08:00:00 1000 mL via INTRAVENOUS

## 2021-10-10 MED ORDER — ONDANSETRON HCL 4 MG/2ML IJ SOLN
4.0000 mg | Freq: Once | INTRAMUSCULAR | Status: AC
  Administered 2021-10-10: 08:00:00 4 mg via INTRAVENOUS
  Filled 2021-10-10: qty 2

## 2021-10-10 MED ORDER — CYCLOBENZAPRINE HCL 10 MG PO TABS: 5.0000 mg | ORAL_TABLET | Freq: Two times a day (BID) | ORAL | 0 refills | Status: DC | PRN

## 2021-10-10 MED ORDER — GADOBUTROL 1 MMOL/ML IV SOLN
9.0000 mL | Freq: Once | INTRAVENOUS | Status: AC | PRN
  Administered 2021-10-10: 15:00:00 9 mL via INTRAVENOUS

## 2021-10-10 MED ORDER — CELECOXIB 200 MG PO CAPS: 200.0000 mg | ORAL_CAPSULE | Freq: Two times a day (BID) | ORAL | 0 refills | Status: DC

## 2021-10-10 MED ORDER — METHYLPREDNISOLONE 4 MG PO TBPK: ORAL_TABLET | ORAL | 0 refills | Status: DC

## 2021-10-10 MED ORDER — LORAZEPAM 2 MG/ML IJ SOLN
1.0000 mg | Freq: Once | INTRAMUSCULAR | Status: AC
  Administered 2021-10-10: 10:00:00 1 mg via INTRAVENOUS
  Filled 2021-10-10: qty 1

## 2021-10-10 MED ORDER — DEXAMETHASONE SODIUM PHOSPHATE 10 MG/ML IJ SOLN
10.0000 mg | Freq: Once | INTRAMUSCULAR | Status: AC
  Administered 2021-10-10: 16:00:00 10 mg via INTRAVENOUS
  Filled 2021-10-10: qty 1

## 2021-10-10 NOTE — Progress Notes (Signed)
°   10/10/21 1559  TOC ED Mini Assessment  TOC Time spent with patient (minutes): 15  PING Used in TOC Assessment No  Admission or Readmission Diverted No  What brought you to the Emergency Department?  weakness  Barrier interventions ED RNCM received a telephone consult from EDP concerning ED evaluation follow up Patient is uninsured and is eligible to establishing access to care with  [facility].  Information forwarded to [employer] to out reach and arrange an ED follow up appointment, contact info will be verified.

## 2021-10-10 NOTE — ED Triage Notes (Signed)
Pt coming from home, complaint of vomiting and diarrhea, knee pain today.

## 2021-10-10 NOTE — ED Provider Notes (Signed)
Emergency Medicine Provider Triage Evaluation Note  Ricardo Wright , a 40 y.o. male  was evaluated in triage.  Pt complains of ongoing L knee pain. Evaluated back in October for this at [facility] with reassuring Xray. Has tried Naproxen and a knee brace, but reports pain persists. Has now noted a resting tremor in his LLE which he feels is related to his discomfort. No fevers, IVDU, bowel/bladder incontinence, genital or perianal numbness, back trauma.  As an aside, patient reports V/D over the past 1-2 days. No known sick contacts.  Review of Systems  Positive: As above Negative: As above  Physical Exam  BP 124/85 (BP Location: Right Arm)    Pulse (!) 119    Temp 99.6 F (37.6 C) (Oral)    Resp 20    SpO2 96%  Gen:   Awake, no distress   Resp:  Normal effort  MSK:   Moves extremities without difficulty  Other:  Tachycardia   Medical Decision Making  Medically screening exam initiated at 4:06 AM.  Appropriate orders placed.  Donna Silverman was informed that the remainder of the evaluation will be completed by another provider, this initial triage assessment does not replace that evaluation, and the importance of remaining in the ED until their evaluation is complete.  Chronic L knee pain, V/D. Screening labs ordered given GI complaints. Prior xray of L knee reviewed which was reassuring and negative for fx, dislocation, bony deformity.   Antony Madura, PA-C 10/10/21 0409    Nira Conn, MD 10/11/21 [phone]

## 2021-10-10 NOTE — Consult Note (Signed)
Neurology Consultation  Reason for Consult: Left LE weakness Referring Physician: Roselyn Bering, MD.   CC: My left leg is weak and shakes.   History is obtained from: patient.   HPI: Armen Waring is a 40 y.o. male with no significant PMHx who presents to the ED today for n/v. However, he mentions that his left leg is weak and his knee gives out when he walks. He hurt is left knee at work in September and his symptoms have gotten worse since. States his left leg shakes when he tries to climb up at work and can also shake if he is just watching TV. He states he locks up his Left knee sometimes, so he can walk more steadily. Never had back problems before. No numbness or tingling, urinary or stool incontinence. He did not have back pain when he "tweaked" his knee, but admits to left buttocks pain for awhile.   With review of MRI Lspine, he does have disc impingement at right L4-5 which is consistent with his exam. His left leg shows some atrophy, weakness, and decrease to cold in L4-5, S1 dermatome.   His mother has had multiple lumbar surgeries with rods/screws in her back.   ROS: A robust ROS was performed and is negative except as noted in the HPI and recent n/v.    Past Medical History:  Diagnosis Date   Chest pain    Dyspnea    Family History  Problem Relation Age of Onset   Diabetes Mother    Cancer Mother    Heart disease Mother    Healthy Father    Social History:   reports that he has been smoking cigarettes. He has been smoking an average of 1 pack per day. He has never used smokeless tobacco. He reports that he does not currently use alcohol. He reports that he does not currently use drugs after having used the following drugs: Marijuana.  Medications No current facility-administered medications for this encounter.  Current Outpatient Medications:    acetaminophen (TYLENOL) 500 MG tablet, Take 1,500 mg by mouth every 6 (six) hours as needed for moderate pain or headache.,  Disp: , Rfl:   Exam: Current vital signs: BP 120/71    Pulse 67    Temp 99.6 F (37.6 C) (Oral)    Resp 18    SpO2 98%  Vital signs in last 24 hours: Temp:  [99.6 F (37.6 C)] 99.6 F (37.6 C) (12/24 0342) Pulse Rate:  [67-119] 67 (12/24 1125) Resp:  [16-20] 18 (12/24 1125) BP: (115-127)/(70-85) 120/71 (12/24 1125) SpO2:  [96 %-99 %] 98 % (12/24 1125)  PE: GENERAL: Well appearing male. Awake, alert in NAD.  HEENT: normocephalic and atraumatic. LUNGS - Normal respiratory effort.  CV - RRR on tele. ABDOMEN - Soft, nontender. Ext: warm, well perfused. Psych: affect light.   NEURO:  Mental Status: Alert and oriented x3. Speech/Language: speech is without dysarthria or aphasia.  Naming, repetition, fluency, and comprehension intact.  Cranial Nerves:  II: PERRL 76mm/brisk. visual fields full.  III, IV, VI: EOMI. Lid elevation symmetric and full.  V: sensation is intact and symmetrical to face.  VII: Smile is symmetrical.  VIII:hearing intact to voice. IX, X: palate elevation is symmetric. Phonation normal.  XI: normal sternocleidomastoid and trapezius muscle strength. XBJ:YNWGNF is symmetrical without fasciculations.   Motor:  BUE 5/5.  RLE: /5 LLE:  knee  4/5   thigh 4/5   plantar flexion   4/5  dorsiflexion   4/5.  Bulk is slightly atrophied in LLE over RLE.  Sensation- Intact to light touch bilaterally in all four extremities. decreased sensation to cold on the lateral aspect of tibial area/L5 dermatomal distribution and to 5th toe areas. Extinction absent to DSS. Coordination: FTN intact bilaterally. HKS intact bilaterally. No pronator drift.  DTRs: 2+ except for left patella 3+ inducible clonus at left ankle.  Babinski (+) L Gait- deferred for safety.   Imaging Reviewed by MD.   MRI brain -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 Correlation with radiographs is recommended prior to any operative intervention. -Rightward lumbar disc  degeneration at L4-L5. Involvement of the right L5 nerve roots in the lateral recess, and up to mild spinal stenosis, but no significant left side neural impingement. Minimal lumbar spine degeneration otherwise.  Assessment: 40 yo male who came in for n/v then mentioned that his LLE was weak and shook at various times. Weakness and atrophy of LLE warranted a MRI L spine with findings of disc hypoplastia and DDD at the same level as his exam findings correlate.   Impression:  -weakness, decreased sensation in left L5 distribution, with atrophy of LLE.  -MRI Lspine with + findings.   Recommendations/Plan:  -Neurosurgery consult-ED to call.  -No other neurology workup needed at this time.   Pt seen by Jimmye Norman, NP/Neuro and later by MD. Note/plan to be edited by MD as needed.  Pager: [phone]   The patient was seen and examined with Jimmye Norman NP. We reviewed the chart and diagnostic studies and agree with plan as outlined in the note above.   Electronically signed by:  Marisue Humble, MD Page: [phone] 10/10/2021, 1:39 PM

## 2021-10-10 NOTE — Discharge Instructions (Addendum)
Contact a health care provider if: Your symptoms are not improving or are getting worse. You have symptoms that come back after going away. You are having a hard time managing at home. Get help right away if you: Cannot care for yourself at home. Have trouble breathing. Cannot walk.

## 2021-10-10 NOTE — ED Provider Notes (Signed)
[facility] EMERGENCY DEPARTMENT Provider Note   CSN: [id number] Arrival date & time: 10/10/21  0335     History Chief Complaint  Patient presents with   Emesis   Diarrhea   Knee Pain    Ricardo Wright is a 40 y.o. male who presents with twofold complaint. First, patient complains that he woke up after his shift yesterday with nausea, vomiting and diarrhea.  He had multiple episodes.  He was unable to hold down any food or fluid.  He went to work but had to leave early due to his illness.  Patient states that he is feeling significantly improved from that and denies any abdominal pain.  He did have increased borborygmi, he denies fevers, chills, cough.  He denies any recent foreign travel, or ingestion of suspicious foods.  No contacts with similar symptoms. Second, the patient reports that back in August he awoke 1 morning with pain in the left knee.  He thought maybe he had "tweaked it at work."  The pain lasted a few days and then resolved but he began feeling like his knee was unstable and he developed a limp without pain.  His limp progressively worsened and he started noticing that his leg would begin shaking uncontrollably.  He states that at times when he has to climb ladders at his job at [employer] he cannot use the right leg because it will give out on him.  He has no knee pain.  On July 29, 2021 he went to an urgent care due to continued feelings of instability in the leg and knee and had a knee x-ray that was negative.  He states that he has had progressively worsening uncontrollable shaking and a limp.  He continues to wear both a knee brace and a copper sleeve.  Several days ago patient began having pain in his left low back radiating into his glutes.  The pain is much worse whenever his leg begins shaking uncontrollably.  Patient states that this morning he was playing a video game and his leg was shaking so hard that he could not visualize the screen.  He states that  his girlfriend made him come in for further evaluation. He has no sensory deficits. He denies saddle anesthesia, bowel or bladder incontinence, IV drug use, procedures to his back, fever, chills, weight loss.   Emesis Associated symptoms: diarrhea   Associated symptoms: no chills and no fever   Diarrhea Associated symptoms: vomiting   Associated symptoms: no chills and no fever   Knee Pain Associated symptoms: no fever       Past Medical History:  Diagnosis Date   Chest pain    Dyspnea     There are no problems to display for this patient.   No past surgical history on file.     Family History  Problem Relation Age of Onset   Diabetes Mother    Cancer Mother    Heart disease Mother    Healthy Father     Social History   Tobacco Use   Smoking status: Every Day    Packs/day: 1.00    Types: Cigarettes   Smokeless tobacco: Never  Vaping Use   Vaping Use: Never used  Substance Use Topics   Alcohol use: Not Currently   Drug use: Not Currently    Types: Marijuana    Home Medications Prior to Admission medications   Medication Sig Start Date End Date Taking? Authorizing Provider  acetaminophen (TYLENOL) 500 MG tablet  Take 1,500 mg by mouth every 6 (six) hours as needed for moderate pain or headache.   Yes [provider]    Allergies    Patient has no known allergies.  Review of Systems   Review of Systems  Constitutional:  Positive for activity change. Negative for chills and fever.  Gastrointestinal:  Positive for diarrhea and vomiting.  Genitourinary: Negative.   Neurological:  Positive for tremors and weakness. Negative for numbness.   Physical Exam Updated Vital Signs BP 124/85 (BP Location: Right Arm)    Pulse (!) 119    Temp 99.6 F (37.6 C) (Oral)    Resp 20    SpO2 96%   Physical Exam Vitals and nursing note reviewed.  Constitutional:      General: He is not in acute distress.    Appearance: He is well-developed. He is not  diaphoretic.  HENT:     Head: Normocephalic and atraumatic.  Eyes:     General: No scleral icterus.    Conjunctiva/sclera: Conjunctivae normal.  Cardiovascular:     Rate and Rhythm: Normal rate and regular rhythm.     Heart sounds: Normal heart sounds.  Pulmonary:     Effort: Pulmonary effort is normal. No respiratory distress.     Breath sounds: Normal breath sounds.  Abdominal:     Palpations: Abdomen is soft.     Tenderness: There is no abdominal tenderness.  Musculoskeletal:     Cervical back: Normal range of motion and neck supple.     Comments: R knee: no swelling, crepitus, pain, instability . No midline spinal tenderness.  He has spasm and tenderness in the left lumbar paraspinal muscles.  Skin:    General: Skin is warm and dry.  Neurological:     Mental Status: He is alert.     Motor: Weakness, tremor, atrophy and abnormal muscle tone present.     Gait: Gait abnormal.     Deep Tendon Reflexes: Reflexes abnormal.     Reflex Scores:      Patellar reflexes are 1+ on the right side and 3+ on the left side.    Comments: Left and right leg examined.  There is mild atrophy of the left leg globally.  He has weakness with both plantar and dorsiflexion of the ankle, worse with dorsiflexion.  Patient weak with knee flexion and extension as well as abduction abduction flexion and extension of the hip.  He has a little bit better strength with extension at the hip on the left.  Patient is able to ambulate with a weak gait.  He has sustained myoclonus at the ankle.  Hyperreflexia in the left patellar reflex.  He is hypertonic with diffuse myoclonus and fasciculation of the muscles.  No sensory deficits  Psychiatric:        Behavior: Behavior normal.    ED Results / Procedures / Treatments   Labs (all labs ordered are listed, but only abnormal results are displayed) Labs Reviewed  CBC WITH DIFFERENTIAL/PLATELET - Abnormal; Notable for the following components:      Result Value   RDW  11.2 (*)    Neutro Abs 8.6 (*)    Lymphs Abs 0.6 (*)    Monocytes Absolute 1.1 (*)    All other components within normal limits  COMPREHENSIVE METABOLIC PANEL - Abnormal; Notable for the following components:   Glucose, Bld 121 (*)    Creatinine, Ser 1.36 (*)    All other components within normal limits  LIPASE, BLOOD  EKG None  Radiology No results found.  Procedures Procedures   Medications Ordered in ED Medications  LORazepam (ATIVAN) injection 1 mg (has no administration in time range)  ondansetron (ZOFRAN) injection 4 mg (4 mg Intravenous Given 10/10/21 0807)  sodium chloride 0.9 % bolus 1,000 mL (1,000 mLs Intravenous New Bag/Given 10/10/21 0808)    ED Course  I have reviewed the triage vital signs and the nursing notes.  Pertinent labs & imaging results that were available during my care of the patient were reviewed by me and considered in my medical decision making (see chart for details).  Clinical Course as of 10/10/21 1624  Sat Oct 10, 2021  1100 I reviewed the patient's MRI which does not show any significant abnormalities.  I discussed the case with Dr. Thomasena Edis of neurology who will formally consult on the patient [AH]  1346 Discussed case with Dr. Conchita Paris who has reveiwed findings and feels this is a thoracic issue. He asks that we proceed with a thoracic MRI. Updated patient. Who agrees. [AH]    Clinical Course User Index [AH] Arthor Captain, PA-C   MDM Rules/Calculators/A&P                         40 year old male here with complaint of nausea vomiting and diarrhea which have resolved.  Patient was initially tachycardic with significant improvement with some fluids IV.  He has had no active vomiting or diarrhea throughout his extensively long ER visit of 12 hours and 33 minutes.   I reviewed the patient's labs which shows no elevated white blood cell count, CMP with mildly elevated creatinine likely due to dehydration, lipase within normal  limits.   Images: I reviewed all MRI images including thoracic and lumbar spine   Patient has significantly abnormal findings in his left leg including myoclonus, atrophy and weakness.  Differential diagnosis includes compressive myelopathy, transverse myelitis, multiple sclerosis. I consulted with neurology after initial MRI did not show significant cord compression of the cord.  They suggested consult with Dr. Conchita Paris.  I discussed the findings with Dr. Conchita Paris and ordered thoracic spine film on his recommendation. Thoracic film returned showing no significant compression of myelopathy.  There is no surgical emergency at this time.  Patient will need significant follow-up to rule out other causes and likely further imaging and investigation.  Unfortunately patient is uninsured.  I consulted with our case manager Dr. Rosezena Sensor who is referred the patient to the internal medicine residency service for further evaluation.  He denies a history of IV drug use and has no weakness or other neurologic findings and other extremities.  Patient is ambulatory with a limp and has to lock the knee for ambulation.  He I gave the patient Decadron here and will discharge home with Flexeril, Decadron and Celebrex.  Discussed outpatient follow-up and return precautions.    Final Clinical Impression(s) / ED Diagnoses Final diagnoses:  None    Rx / DC Orders ED Discharge Orders     None        Arthor Captain, PA-C 10/10/21 1629    Linwood Dibbles, MD 10/11/21 [phone]

## 2021-10-10 NOTE — ED Notes (Signed)
Pt in mri 

## 2021-10-10 NOTE — ED Notes (Signed)
Patient transported to MRI 

## 2021-10-11 ENCOUNTER — Telehealth (HOSPITAL_COMMUNITY): Payer: Self-pay | Admitting: Emergency Medicine

## 2021-10-11 ENCOUNTER — Telehealth: Payer: Self-pay | Admitting: Surgery

## 2021-10-11 MED ORDER — METHYLPREDNISOLONE 4 MG PO TBPK: ORAL_TABLET | ORAL | 0 refills | Status: DC

## 2021-10-11 MED ORDER — CYCLOBENZAPRINE HCL 10 MG PO TABS: 5.0000 mg | ORAL_TABLET | Freq: Two times a day (BID) | ORAL | 0 refills | Status: DC | PRN

## 2021-10-11 MED ORDER — CELECOXIB 200 MG PO CAPS: 200.0000 mg | ORAL_CAPSULE | Freq: Two times a day (BID) | ORAL | 0 refills | Status: DC

## 2021-10-11 NOTE — Telephone Encounter (Signed)
°  [facility] Medication Assistance Card Name: [name] ID (MRN): [id number] Bin: [id number] RX Group: [id number] Discharge Date: 10/11/2021 Expiration Date: 10/23/2021                                           (must be filled within 7 days of discharge)     Dear   : Ricardo Wright  You have been approved to have the prescriptions written by your discharging physician filled through our [facility] (Medication Assistance Through [facility]) program. This program allows for a one-time (no refills) 34-day supply of selected medications for a low copay amount.  The copay is $3.00 per prescription. For instance, if you have one prescription, you will pay $3.00; for two prescriptions, you pay $6.00; for three prescriptions, you pay $9.00; and so on.  Only certain pharmacies are participating in this program with [facility]. You will need to select one of the pharmacies from the attached list and take your prescriptions, this letter, and your photo ID to one of the participating pharmacies.   We are excited that you are able to use the [facility] program to get your medications. These prescriptions must be filled within 7 days of hospital discharge or they will no longer be valid for the [facility] program. Should you have any problems with your prescriptions please contact your case management team member at [phone] for [place]/ [facility].  Thank you, [facility] Management

## 2021-10-11 NOTE — Telephone Encounter (Signed)
I accidentally printed medrol scrpit- now sent as e script.

## 2021-10-11 NOTE — Telephone Encounter (Signed)
I saw this patient in the emergency department yesterday. Due to the Christmas holiday his requested pharmacy is closed and he is asked that I call his medications into an open pharmacy.

## 2021-10-11 NOTE — Telephone Encounter (Signed)
ED RNCM received call from patient concerning the pharmacy being closed today on Christmas.  Patient is requesting prescriptions be sent to the [facility] [address]. Patient also states that he is uninsured and may need some assistance paying for the prescription.  Patient is eligible for [facility] medication assistance. Patient enrolled and voucher [facility] [address].Discussed with EDP and prescription will [facility] to [facility] [address].  Patient will be updated.  No further ED Care Management needs.

## 2021-10-22 ENCOUNTER — Telehealth: Payer: Self-pay | Admitting: Surgery

## 2021-10-22 NOTE — Telephone Encounter (Signed)
ED RNCM contacted patient concerning the scheduling of follow up appt. With [facility]. Patient states he had not been able to secure the follow up appointment.  ED RNCM will contact the clinic scheduler in the am to assist with arranging an appointment.. will follow up with patient tomorrow 1/6

## 2021-10-23 ENCOUNTER — Telehealth: Payer: Self-pay | Admitting: Surgery

## 2021-10-23 NOTE — Telephone Encounter (Signed)
New patient Appointment scheduled with [facility] 1/10 at 3:15. Updated patient concerning appt. by text message.

## 2021-10-27 ENCOUNTER — Encounter: Payer: Self-pay | Admitting: Student

## 2021-10-27 ENCOUNTER — Ambulatory Visit (INDEPENDENT_AMBULATORY_CARE_PROVIDER_SITE_OTHER): Payer: Self-pay | Admitting: Student

## 2021-10-27 ENCOUNTER — Other Ambulatory Visit: Payer: Self-pay

## 2021-10-27 VITALS — BP 127/96 | HR 94 | Temp 98.1°F | Ht 72.0 in | Wt 204.4 lb

## 2021-10-27 DIAGNOSIS — F1290 Cannabis use, unspecified, uncomplicated: Secondary | ICD-10-CM

## 2021-10-27 DIAGNOSIS — Z720 Tobacco use: Secondary | ICD-10-CM

## 2021-10-27 DIAGNOSIS — M545 Low back pain, unspecified: Secondary | ICD-10-CM

## 2021-10-27 DIAGNOSIS — R29898 Other symptoms and signs involving the musculoskeletal system: Secondary | ICD-10-CM

## 2021-10-27 DIAGNOSIS — G8929 Other chronic pain: Secondary | ICD-10-CM

## 2021-10-27 MED ORDER — GABAPENTIN 300 MG PO CAPS: 300.0000 mg | ORAL_CAPSULE | Freq: Every day | ORAL | 2 refills | Status: DC

## 2021-10-27 NOTE — Patient Instructions (Signed)
It was a pleasure seeing you in clinic. Today we discussed:   Knee weakness and back pain: We will check labs today to see if there is any else that may be causing your leg pain.   If you have any questions or concerns, please call our clinic at [phone] between 9am-5pm and after hours call [phone] and ask for the internal medicine resident on call. If you feel you are having a medical emergency please call 911.   Thank you, we look forward to helping you remain healthy!

## 2021-10-28 DIAGNOSIS — Z720 Tobacco use: Secondary | ICD-10-CM | POA: Insufficient documentation

## 2021-10-28 DIAGNOSIS — R29898 Other symptoms and signs involving the musculoskeletal system: Secondary | ICD-10-CM | POA: Insufficient documentation

## 2021-10-28 DIAGNOSIS — F1290 Cannabis use, unspecified, uncomplicated: Secondary | ICD-10-CM | POA: Insufficient documentation

## 2021-10-28 LAB — BMP8+ANION GAP
Anion Gap: 15 mmol/L (ref 10.0–18.0)
BUN/Creatinine Ratio: 8 — ABNORMAL LOW (ref 9–20)
BUN: 9 mg/dL (ref 6–24)
CO2: 23 mmol/L (ref 20–29)
Calcium: 10.1 mg/dL (ref 8.7–10.2)
Chloride: 103 mmol/L (ref 96–106)
Creatinine, Ser: 1.08 mg/dL (ref 0.76–1.27)
Glucose: 100 mg/dL — ABNORMAL HIGH (ref 70–99)
Potassium: 4.2 mmol/L (ref 3.5–5.2)
Sodium: 141 mmol/L (ref 134–144)
eGFR: 89 mL/min/{1.73_m2} (ref >59)

## 2021-10-28 LAB — HEPATITIS C ANTIBODY: Hep C Virus Ab: <0.1 s/co ratio (ref 0.0–0.9)

## 2021-10-28 LAB — VITAMIN B12: Vitamin B-12: 537 pg/mL (ref 232–1245)

## 2021-10-28 LAB — C-REACTIVE PROTEIN: CRP: <1 mg/L (ref 0–10)

## 2021-10-28 LAB — HIV ANTIBODY (ROUTINE TESTING W REFLEX): HIV Screen 4th Generation wRfx: NONREACTIVE

## 2021-10-28 LAB — TSH: TSH: 0.974 u[IU]/mL (ref 0.450–4.500)

## 2021-10-28 NOTE — Progress Notes (Signed)
New Patient Office Visit  Subjective:  Patient ID: Ricardo Wright, male    DOB: 1981-10-02  Age: 41 y.o. MRN: [id number]  CC:  Chief Complaint  Patient presents with   Follow-up    ED FOLLOW UP / LOWER BACK PAIN  ( 7) WITH TIGHTNESS BETWEEN SHOULDERS     HPI Ricardo Wright is a 41 year old male with no significant past medical history who presents to clinic today to establish care and follow up on left leg weakness with associated left sided back pain. Please refer to problem based charting for further details and assessment and plan of current problem and chronic medical conditions.   Past Medical History:  Diagnosis Date   Chest pain    Dyspnea     No past surgical history on file.  Family History  Problem Relation Age of Onset   Diabetes Mother    Cancer Mother    Heart disease Mother    Healthy Father     Social History   Socioeconomic History   Marital status: Single    Spouse name: Not on file   Number of children: Not on file   Years of education: Not on file   Highest education level: Not on file  Occupational History   Not on file  Tobacco Use   Smoking status: Every Day    Packs/day: 1.00    Types: Cigarettes   Smokeless tobacco: Never  Vaping Use   Vaping Use: Never used  Substance and Sexual Activity   Alcohol use: Not Currently   Drug use: Not Currently    Types: Marijuana   Sexual activity: Yes  Other Topics Concern   Not on file  Social History Narrative   Not on file   Social Determinants of Health   Financial Resource Strain: Not on file  Food Insecurity: Not on file  Transportation Needs: Not on file  Physical Activity: Not on file  Stress: Not on file  Social Connections: Not on file  Intimate Partner Violence: Not on file    ROS Review of Systems  Constitutional:  Positive for appetite change. Negative for chills and fever.  HENT:  Negative for hearing loss, sore throat, trouble swallowing and voice change.   Eyes:   Negative for pain and visual disturbance.  Respiratory:  Negative for cough and shortness of breath.   Cardiovascular:  Negative for chest pain and palpitations.  Gastrointestinal:  Positive for blood in stool and nausea. Negative for abdominal pain, constipation and vomiting.  Genitourinary:  Negative for dysuria, frequency and urgency.  Musculoskeletal:  Positive for back pain and gait problem. Negative for joint swelling and neck pain.  Skin:  Negative for rash and wound.  Neurological:  Positive for weakness (left leg) and numbness (left leg). Negative for dizziness, syncope and facial asymmetry.  Psychiatric/Behavioral:  Negative for behavioral problems and confusion.    Objective:   Today's Vitals: BP (!) 127/96 (BP Location: Right Arm, Patient Position: Sitting, Cuff Size: Normal)    Pulse 94    Temp 98.1 F (36.7 C) (Oral)    Ht 6' (1.829 m)    Wt 204 lb 6.4 oz (92.7 kg)    SpO2 100%    BMI 27.72 kg/m   Physical Exam Constitutional:      Appearance: Normal appearance.  HENT:     Mouth/Throat:     Mouth: Mucous membranes are moist.     Pharynx: Oropharynx is clear.  Eyes:  Extraocular Movements: Extraocular movements intact.     Conjunctiva/sclera: Conjunctivae normal.     Pupils: Pupils are equal, round, and reactive to light.  Cardiovascular:     Rate and Rhythm: Normal rate and regular rhythm.     Pulses: Normal pulses.     Heart sounds: Normal heart sounds. No murmur heard.   No friction rub. No gallop.  Pulmonary:     Effort: Pulmonary effort is normal.     Breath sounds: No rhonchi or rales.  Abdominal:     General: Abdomen is flat. Bowel sounds are normal. There is no distension.     Palpations: Abdomen is soft. There is no mass.     Tenderness: There is no abdominal tenderness.  Musculoskeletal:        General: No swelling.     Cervical back: Normal range of motion and neck supple.     Comments: LLE with decreased bulk, appears atrophied compared to the  RLE, no TTP of the left knee, ankle, or hip, TTP of the left lumbar area, no midline tenderness  Skin:    General: Skin is warm and dry.     Coloration: Skin is not jaundiced.     Findings: No bruising, erythema or rash.  Neurological:     Mental Status: He is alert and oriented to person, place, and time.     Cranial Nerves: No facial asymmetry.     Sensory: Sensory deficit (LLE) present.     Motor: Weakness (left hip flexion (4/5), left knee extension(4/5), and left ankle(4/5)) present.     Coordination: Romberg sign negative. Heel to [facility] Test abnormal (slowed secondary to weakness). Finger-Nose-Finger Test normal.     Gait: Gait abnormal (antalgic).     Deep Tendon Reflexes: Babinski sign absent on the right side. Babinski sign (equivocal) present on the left side.     Reflex Scores:      Patellar reflexes are 2+ on the right side and 2+ on the left side.    Comments: 3 beat clonus of the left ankle  Psychiatric:        Mood and Affect: Mood normal.        Behavior: Behavior normal.    Assessment & Plan:   Problem List Items Addressed This Visit   None Visit Diagnoses     Chronic left-sided low back pain, unspecified whether sciatica present    -  Primary   Relevant Medications   gabapentin (NEURONTIN) 300 MG capsule   Other Relevant Orders   BMP8+Anion Gap (Completed)   Vitamin B12 (Completed)   HIV antibody (with reflex) (Completed)   Hepatitis C antibody (Completed)   TSH (Completed)   CRP (C-Reactive Protein) (Completed)       Outpatient Encounter Medications as of 10/27/2021  Medication Sig   gabapentin (NEURONTIN) 300 MG capsule Take 1 capsule (300 mg total) by mouth at bedtime.   acetaminophen (TYLENOL) 500 MG tablet Take 1,500 mg by mouth every 6 (six) hours as needed for moderate pain or headache.   celecoxib (CELEBREX) 200 MG capsule Take 1 capsule (200 mg total) by mouth 2 (two) times daily.   celecoxib (CELEBREX) 200 MG capsule Take 1 capsule (200 mg  total) by mouth 2 (two) times daily.   cyclobenzaprine (FLEXERIL) 10 MG tablet Take 0.5-1 tablets (5-10 mg total) by mouth 2 (two) times daily as needed for muscle spasms.   cyclobenzaprine (FLEXERIL) 10 MG tablet Take 0.5-1 tablets (5-10 mg total) by mouth 2 (two)  times daily as needed for muscle spasms.   methylPREDNISolone (MEDROL DOSEPAK) 4 MG TBPK tablet Use as directed   methylPREDNISolone (MEDROL DOSEPAK) 4 MG TBPK tablet Use as directed   methylPREDNISolone (MEDROL DOSEPAK) 4 MG TBPK tablet Use as directed   No facility-administered encounter medications on file as of 10/27/2021.    Follow-up: Return in about 1 week  Iona Beard, MD  Discussed with Dr. Cain Sieve, MD

## 2021-10-28 NOTE — Assessment & Plan Note (Signed)
Patient is a current smoker. Smokes 1 ppd since age 41. Has not tried to quit smoking in the past. Will need to discuss cessation at next visit.

## 2021-10-28 NOTE — Assessment & Plan Note (Addendum)
Patient with left sided leg weakness that started in June 2022. He works at [employer] loading trucks. Initially though he pulled a muscle with tightness behind the left knee. This continued to worsen and he started to have left knee instability with the knee intermittently giving out. Was evaluated at Urgent care on 07/29/2021.  Left knee x-ray at that time without fractures or joint space disease.  He was discharged home with naproxen.  He continued to have worsening left knee weakness and instability with episodes of shaking of the left knee.  Describes that he has difficulty controlling the left leg.  He subsequently was reevaluated on 12/24, [facility], ED.  Was noted to have atrophy of the left leg compared to the right with diminished sensation of the left lower extremity, hyperreflexia of the left patella and myoclonus and fast fasciculations.  Neurology was consulted and patient had MRI of the thoracic and lumbar spine.  MRI lumbar spine with hypoplastic L5-S1 disc as well as rightward lumbar disc degeneration at L4-L5 with involvement of the right L5 roots with mild spinal stenosis but no significant left-sided impingement.  MRI thoracic spine with noncompressive disc bulge at T6-T7.  Neurosurgery was consulted and no emergent surgical intervention was recommended given lack of significant compressive myelopathy.  He was discharged with Decadron taper as well as Flexeril and Celebrex.  Patient notes that he continues to have left leg weakness with associated left lumbar back pain.  He feels that the medications from the ED did not help his pain or weakness much.  Now feels a tightness behind his right knee similar to when his left-sided symptoms started.  Otherwise noted that he had some nausea, vomiting, diarrhea when he was seen in the ED with this has since resolved.  Did note 1 episode of bloody stool yesterday.  He denies fevers, chills, chest pain, shortness of breath, falls, syncope.  Does feel that  sensation of the left lower extremity is diminished.  She walks with a limp and gait due to the fact that he feels that he does not have good control of his left leg and is afraid that it will give out on him if he puts more weight on it.  On exam redemonstration of atrophy of the left lower extremity with myoclonus.  Patellar reflexes symmetric.  Equivocal Babinski on the left side and downgoing Babinski on the right.  Her for extremity exam and cranial nerve exams were unremarkable.  Broad differential for what could be causing his lower extremity weakness and atrophy including vasculitis, B12 deficiency, amyloid, diabetes, infectious, lung malignancy given history of tobacco. Patient currently waiting oninsurance card through work.   Plan Check B12, BMP, TSH, CRP, sed rate, HIV, hep C, A1c today Gabapentin 300 mg a night for pain Plan to refer for PT and EMG once he gets insurance Follow up in 1 week  Addendum BMP, B12, TSH, CRP, HIV, hep C wnl Unfortunately patient left before sed rate and A1c will draw at next visit

## 2021-11-02 NOTE — Progress Notes (Signed)
.  Internal Medicine Clinic Attending  Case discussed with Dr. Elaina Pattee  At the time of the visit.  We reviewed the residents history and exam and pertinent patient test results.  I agree with the assessment, diagnosis, and plan of care documented in the residents note.   Etiology of his muscle weakness & atrophy remains unclear. Initial lab testing today was all normal. Once his insurance goes through, hopefully by next visit, will refer to Neurology. He's had MRI, but EMG might be helpful next step.

## 2021-11-04 ENCOUNTER — Ambulatory Visit (INDEPENDENT_AMBULATORY_CARE_PROVIDER_SITE_OTHER): Payer: BC Managed Care – PPO | Admitting: Student

## 2021-11-04 ENCOUNTER — Encounter: Payer: Self-pay | Admitting: Student

## 2021-11-04 VITALS — BP 129/92 | HR 87 | Wt 212.3 lb

## 2021-11-04 DIAGNOSIS — R29898 Other symptoms and signs involving the musculoskeletal system: Secondary | ICD-10-CM

## 2021-11-04 LAB — POCT GLYCOSYLATED HEMOGLOBIN (HGB A1C): Hemoglobin A1C: 4.8 % (ref 4.0–5.6)

## 2021-11-04 LAB — GLUCOSE, CAPILLARY: Glucose-Capillary: 118 mg/dL — ABNORMAL HIGH (ref 70–99)

## 2021-11-04 MED ORDER — GABAPENTIN 300 MG PO CAPS: 300.0000 mg | ORAL_CAPSULE | Freq: Three times a day (TID) | ORAL | 2 refills | Status: DC

## 2021-11-04 NOTE — Assessment & Plan Note (Signed)
Presents for follow-up of left lower extremity weakness.  Reports some improvement when he was taking steroid taper but now is having more shakiness and instability in the left knee and leg.  Also has worsening pain between his shoulder blades and shoulders.  Shoulders somewhat weak on exam although this may be limited to pain.  Laboratory work-up negative so far.  A1c checked today within normal limits.  Given worsening symptoms, atrophy, and fasciculations concerning for demyelinating disease.  Plan Referral to neurology Increase gabapentin to 300 mg 3 times daily

## 2021-11-04 NOTE — Progress Notes (Signed)
° °  CC: LLE weakness  HPI:  Mr.Zaylon Griep is a 41 y.o. male history of left lower extremity weakness, atrophy presents for follow-up. Please refer to problem based charting for further details and assessment and plan of current problem and chronic medical conditions.   Past Medical History:  Diagnosis Date   Chest pain    Dyspnea    Review of Systems:  negative as per HPI  Physical Exam:  Vitals:   11/04/21 0954  BP: (!) 129/92  Pulse: 87  SpO2: 99%  Weight: 212 lb 4.8 oz (96.3 kg)   Physical Exam Constitutional:      Appearance: Normal appearance.  HENT:     Nose: Nose normal.     Mouth/Throat:     Mouth: Mucous membranes are moist.     Pharynx: Oropharynx is clear.  Eyes:     Extraocular Movements: Extraocular movements intact.     Pupils: Pupils are equal, round, and reactive to light.  Cardiovascular:     Rate and Rhythm: Normal rate and regular rhythm.     Heart sounds: No murmur heard.   No friction rub. No gallop.  Pulmonary:     Effort: Pulmonary effort is normal.     Breath sounds: Normal breath sounds.  Abdominal:     General: Abdomen is flat. Bowel sounds are normal.     Palpations: Abdomen is soft.  Musculoskeletal:     Comments: TTP diffusely thoracic back  Skin:    General: Skin is warm and dry.     Findings: No rash.  Neurological:     Mental Status: He is alert.     Comments: 2+ DTR bilaterally, myoclonus of the left ankle. LLE appears atrophied, 4/5 strenth of left hips, knees, 4/5 strength bilateral shoulders limited by pain     Assessment & Plan:   See Encounters Tab for problem based charting.  Patient discussed with Dr. Heide Spark

## 2021-11-04 NOTE — Patient Instructions (Signed)
Leg pain You can increase your gabapentin to 300 mg three times a day, or try taking 600 mg at nightime and 300mg  in the daytime I have placed a referral to neurology to further evaluate this

## 2021-11-05 NOTE — Progress Notes (Signed)
Internal Medicine Clinic Attending ? ?Case discussed with Dr. Liang  At the time of the visit.  We reviewed the resident?s history and exam and pertinent patient test results.  I agree with the assessment, diagnosis, and plan of care documented in the resident?s note. ? ?

## 2021-11-12 NOTE — Progress Notes (Deleted)
NEUROLOGY CONSULTATION NOTE  [name] MRN: [id number] DOB: 04-01-1981  Referring provider: Aldine Contes, MD Primary care provider: Iona Beard, MD  Reason for consult:  left leg weakness  Assessment/Plan:   ***   Subjective:  Ricardo Wright is a 41 year old male who presents for left leg weakness.  History supplemented by PCP and ED notes.  In August, he woke up one morning with left knee pain.  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  Uncertain if he may have strained it at work.  Pain lasted several days and resolved.  However, it began feeling unstable afterwards and he started ambulating with a limp but no pain.  He was seen in the ED in October where meniscus tear was suspected but X-ray of left knee was normal.  He then noticed that his leg would begin shaking uncontrollably.  He also reported right knee pain as well.  Back pain radiating down left leg.  He returned to the ED in December.  He had MRI of thoracic and lumbar spine performed, personally reviewed, which demonstrated hypoplastic L5-S1 disc and rightward L4-5 degenerative disc with involvement of the right L5 nerve root but otherwise no myelopathy or significant spinal canal stenosis.  He also developed bilateral shoulder and upper back pain.  On exam PCP noted atrophy and fasciculations of the left leg.  Labs in January include B12 537, negative HIVab, negative HCV ab, TSH 0.974, negative CRP       PAST MEDICAL HISTORY: Past Medical History:  Diagnosis Date   Chest pain    Dyspnea     PAST SURGICAL HISTORY: No past surgical history on file.  MEDICATIONS: Current Outpatient Medications on File Prior to Visit  Medication Sig Dispense Refill   acetaminophen (TYLENOL) 500 MG tablet Take 1,500 mg by mouth every 6 (six) hours as needed for moderate pain or headache.     celecoxib (CELEBREX) 200 MG capsule Take 1 capsule (200 mg total) by mouth 2 (two) times daily. 20 capsule 0   celecoxib  (CELEBREX) 200 MG capsule Take 1 capsule (200 mg total) by mouth 2 (two) times daily. 20 capsule 0   cyclobenzaprine (FLEXERIL) 10 MG tablet Take 0.5-1 tablets (5-10 mg total) by mouth 2 (two) times daily as needed for muscle spasms. 20 tablet 0   cyclobenzaprine (FLEXERIL) 10 MG tablet Take 0.5-1 tablets (5-10 mg total) by mouth 2 (two) times daily as needed for muscle spasms. 20 tablet 0   gabapentin (NEURONTIN) 300 MG capsule Take 1 capsule (300 mg total) by mouth 3 (three) times daily. 30 capsule 2   methylPREDNISolone (MEDROL DOSEPAK) 4 MG TBPK tablet Use as directed 21 tablet 0   methylPREDNISolone (MEDROL DOSEPAK) 4 MG TBPK tablet Use as directed 21 tablet 0   methylPREDNISolone (MEDROL DOSEPAK) 4 MG TBPK tablet Use as directed 21 tablet 0   No current facility-administered medications on file prior to visit.    ALLERGIES: No Known Allergies  FAMILY HISTORY: Family History  Problem Relation Age of Onset   Diabetes Mother    Cancer Mother    Heart disease Mother    Healthy Father     Objective:  *** General: No acute distress.  Patient appears well-groomed.   Head:  Normocephalic/atraumatic Eyes:  fundi examined but not visualized Neck: supple, no paraspinal tenderness, full range of motion Back: No paraspinal tenderness Heart: regular rate and rhythm Lungs: Clear to auscultation bilaterally. Vascular: No carotid bruits. Neurological Exam: Mental status:  alert and oriented to person, place, and time, recent and remote memory intact, fund of knowledge intact, attention and concentration intact, speech fluent and not dysarthric, language intact. Cranial nerves: CN I: not tested CN II: pupils equal, round and reactive to light, visual fields intact CN III, IV, VI:  full range of motion, no nystagmus, no ptosis CN V: facial sensation intact. CN VII: upper and lower face symmetric CN VIII: hearing intact CN IX, X: gag intact, uvula midline CN XI: sternocleidomastoid and  trapezius muscles intact CN XII: tongue midline Bulk & Tone: normal, no fasciculations. Motor:  muscle strength 5/5 throughout Sensation:  Pinprick, temperature and vibratory sensation intact. Deep Tendon Reflexes:  2+ throughout,  toes downgoing.   Finger to nose testing:  Without dysmetria.   Heel to shin:  Without dysmetria.   Gait:  Normal station and stride.  Romberg negative.    Thank you for allowing me to take part in the care of this patient.  Metta Clines, DO  CC: ***

## 2021-11-13 ENCOUNTER — Ambulatory Visit: Payer: Self-pay | Admitting: Neurology

## 2021-11-13 DIAGNOSIS — Z029 Encounter for administrative examinations, unspecified: Secondary | ICD-10-CM

## 2021-11-18 NOTE — Progress Notes (Deleted)
NEUROLOGY CONSULTATION NOTE  Ricardo Wright MRN: [id number] DOB: 1981-05-28  Referring provider: Aldine Contes, MD Primary care provider: Iona Beard, MD  Reason for consult:  left leg weakness  Assessment/Plan:   ***   Subjective:  Ricardo Wright is a 41 year old male who presents for left leg weakness.  History supplemented by PCP and ED notes.  In August, he woke up one morning with left knee pain.  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  Uncertain if he may have strained it at work.  Pain lasted several days and resolved.  However, it began feeling unstable afterwards and he started ambulating with a limp but no pain.  He was seen in the ED in October where meniscus tear was suspected but X-ray of left knee was normal.  He then noticed that his leg would begin shaking uncontrollably.  He also reported right knee pain as well.  Back pain radiating down left leg.  He returned to the ED in December.  He had MRI of thoracic and lumbar spine performed, personally reviewed, which demonstrated hypoplastic L5-S1 disc and rightward L4-5 degenerative disc with involvement of the right L5 nerve root but otherwise no myelopathy or significant spinal canal stenosis.  He also developed bilateral shoulder and upper back pain.  On exam PCP noted atrophy and fasciculations of the left leg.  Labs in January include B12 537, negative HIVab, negative HCV ab, TSH 0.974, negative CRP       PAST MEDICAL HISTORY: Past Medical History:  Diagnosis Date   Chest pain    Dyspnea     PAST SURGICAL HISTORY: No past surgical history on file.  MEDICATIONS: Current Outpatient Medications on File Prior to Visit  Medication Sig Dispense Refill   acetaminophen (TYLENOL) 500 MG tablet Take 1,500 mg by mouth every 6 (six) hours as needed for moderate pain or headache.     celecoxib (CELEBREX) 200 MG capsule Take 1 capsule (200 mg total) by mouth 2 (two) times daily. 20 capsule 0   celecoxib  (CELEBREX) 200 MG capsule Take 1 capsule (200 mg total) by mouth 2 (two) times daily. 20 capsule 0   cyclobenzaprine (FLEXERIL) 10 MG tablet Take 0.5-1 tablets (5-10 mg total) by mouth 2 (two) times daily as needed for muscle spasms. 20 tablet 0   cyclobenzaprine (FLEXERIL) 10 MG tablet Take 0.5-1 tablets (5-10 mg total) by mouth 2 (two) times daily as needed for muscle spasms. 20 tablet 0   gabapentin (NEURONTIN) 300 MG capsule Take 1 capsule (300 mg total) by mouth 3 (three) times daily. 30 capsule 2   methylPREDNISolone (MEDROL DOSEPAK) 4 MG TBPK tablet Use as directed 21 tablet 0   methylPREDNISolone (MEDROL DOSEPAK) 4 MG TBPK tablet Use as directed 21 tablet 0   methylPREDNISolone (MEDROL DOSEPAK) 4 MG TBPK tablet Use as directed 21 tablet 0   No current facility-administered medications on file prior to visit.    ALLERGIES: No Known Allergies  FAMILY HISTORY: Family History  Problem Relation Age of Onset   Diabetes Mother    Cancer Mother    Heart disease Mother    Healthy Father     Objective:  *** General: No acute distress.  Patient appears well-groomed.   Head:  Normocephalic/atraumatic Eyes:  fundi examined but not visualized Neck: supple, no paraspinal tenderness, full range of motion Back: No paraspinal tenderness Heart: regular rate and rhythm Lungs: Clear to auscultation bilaterally. Vascular: No carotid bruits. Neurological Exam: Mental status:  alert and oriented to person, place, and time, recent and remote memory intact, fund of knowledge intact, attention and concentration intact, speech fluent and not dysarthric, language intact. Cranial nerves: CN I: not tested CN II: pupils equal, round and reactive to light, visual fields intact CN III, IV, VI:  full range of motion, no nystagmus, no ptosis CN V: facial sensation intact. CN VII: upper and lower face symmetric CN VIII: hearing intact CN IX, X: gag intact, uvula midline CN XI: sternocleidomastoid and  trapezius muscles intact CN XII: tongue midline Bulk & Tone: normal, no fasciculations. Motor:  muscle strength 5/5 throughout Sensation:  Pinprick, temperature and vibratory sensation intact. Deep Tendon Reflexes:  2+ throughout,  toes downgoing.   Finger to nose testing:  Without dysmetria.   Heel to shin:  Without dysmetria.   Gait:  Normal station and stride.  Romberg negative.    Thank you for allowing me to take part in the care of this patient.  Metta Clines, DO  CC: Iona Beard, MD

## 2021-11-19 ENCOUNTER — Ambulatory Visit: Payer: BC Managed Care – PPO | Admitting: Neurology

## 2021-12-09 ENCOUNTER — Ambulatory Visit: Payer: BC Managed Care – PPO | Admitting: Neurology

## 2021-12-31 ENCOUNTER — Ambulatory Visit: Payer: BC Managed Care – PPO | Admitting: Diagnostic Neuroimaging

## 2021-12-31 ENCOUNTER — Encounter: Payer: Self-pay | Admitting: Diagnostic Neuroimaging

## 2021-12-31 ENCOUNTER — Telehealth: Payer: Self-pay | Admitting: Diagnostic Neuroimaging

## 2021-12-31 VITALS — BP 136/80 | HR 81 | Ht 73.0 in | Wt 217.0 lb

## 2021-12-31 DIAGNOSIS — R200 Anesthesia of skin: Secondary | ICD-10-CM | POA: Diagnosis not present

## 2021-12-31 DIAGNOSIS — H538 Other visual disturbances: Secondary | ICD-10-CM | POA: Diagnosis not present

## 2021-12-31 DIAGNOSIS — M7989 Other specified soft tissue disorders: Secondary | ICD-10-CM | POA: Diagnosis not present

## 2021-12-31 DIAGNOSIS — R531 Weakness: Secondary | ICD-10-CM | POA: Diagnosis not present

## 2021-12-31 MED ORDER — BACLOFEN 10 MG PO TABS: 10.0000 mg | ORAL_TABLET | Freq: Three times a day (TID) | ORAL | 0 refills | Status: DC

## 2021-12-31 NOTE — Patient Instructions (Signed)
-   MRI brain, cervical spine  ? ?- check leg ultrasound ? ?- trial of baclofen 5-10mg  three times a day for spasms ?

## 2021-12-31 NOTE — Telephone Encounter (Signed)
pt has US imaging, order faxed they will reach out to the patient it is marked as urgent- if he goes through [place] [facility], it is covered at 100%  ?

## 2021-12-31 NOTE — Progress Notes (Signed)
? ?[facility] ? ?PATIENT: Ricardo Wright ?DOB: 1981/05/27 ? ?REFERRING CLINICIAN: Earl Lagos, MD ?HISTORY FROM: patient and wife  ?REASON FOR VISIT: new consult  ? ? ?HISTORICAL ? ?CHIEF COMPLAINT:  ?Chief Complaint  ?Patient presents with  ? Extremity Weakness  ?  RM 7 with spouse Kristi  ?Pt is well, has been having L leg weakness since July 2022. No onset triggers he is aware of.   ? ? ?HISTORY OF PRESENT ILLNESS:  ? ?41 year old male here for evaluation of left leg weakness. ? ?December 2022 patient had gradual onset of left leg spasms, weakness and limping.  Now patient having some problems with right leg.  Sometimes his left leg feels burning pain, sometimes it feels cold.  He has noticed that his left calf has shrunk in size.  Has also noted some intermittent shaking in his left upper extremity.  He has pain and tension in the back of his neck.  He has noted some blurred vision and double vision.  He is having some problems with urinary control. ? ?Patient went to the emergency room on 10/10/2021 and had MRI of the thoracic and lumbar spine which showed no significant problems.  Symptoms have progressed since that time.  He went to PCP in January and then referred here for evaluation. ? ?Yesterday patient noticed some swelling in the right leg with pain and tenderness in his calf.  Denies any chest pain or breathing problems. ? ? ? ?REVIEW OF SYSTEMS: Full 14 system review of systems performed and negative with exception of: as per HPI. ? ?ALLERGIES: ?No Known Allergies ? ?HOME MEDICATIONS: ?Outpatient Medications Prior to Visit  ?Medication Sig Dispense Refill  ? acetaminophen (TYLENOL) 500 MG tablet Take 1,500 mg by mouth every 6 (six) hours as needed for moderate pain or headache.    ? celecoxib (CELEBREX) 200 MG capsule Take 1 capsule (200 mg total) by mouth 2 (two) times daily. 20 capsule 0  ? cyclobenzaprine (FLEXERIL) 10 MG tablet Take 0.5-1 tablets (5-10 mg total) by mouth 2  (two) times daily as needed for muscle spasms. 20 tablet 0  ? gabapentin (NEURONTIN) 300 MG capsule Take 1 capsule (300 mg total) by mouth 3 (three) times daily. 30 capsule 2  ? methylPREDNISolone (MEDROL DOSEPAK) 4 MG TBPK tablet Use as directed 21 tablet 0  ? celecoxib (CELEBREX) 200 MG capsule Take 1 capsule (200 mg total) by mouth 2 (two) times daily. (Patient not taking: Reported on 12/31/2021) 20 capsule 0  ? cyclobenzaprine (FLEXERIL) 10 MG tablet Take 0.5-1 tablets (5-10 mg total) by mouth 2 (two) times daily as needed for muscle spasms. (Patient not taking: Reported on 12/31/2021) 20 tablet 0  ? methylPREDNISolone (MEDROL DOSEPAK) 4 MG TBPK tablet Use as directed (Patient not taking: Reported on 12/31/2021) 21 tablet 0  ? methylPREDNISolone (MEDROL DOSEPAK) 4 MG TBPK tablet Use as directed (Patient not taking: Reported on 12/31/2021) 21 tablet 0  ? ?No facility-administered medications prior to visit.  ? ? ?PAST MEDICAL HISTORY: ?Past Medical History:  ?Diagnosis Date  ? Chest pain   ? Dyspnea   ? ? ?PAST SURGICAL HISTORY: ?History reviewed. No pertinent surgical history. ? ?FAMILY HISTORY: ?Family History  ?Problem Relation Age of Onset  ? Diabetes Mother   ? Cancer Mother   ? Heart disease Mother   ? Healthy Father   ? ? ?SOCIAL HISTORY: ?Social History  ? ?Socioeconomic History  ? Marital status: Single  ?  Spouse name: Not on  file  ? Number of children: Not on file  ? Years of education: Not on file  ? Highest education level: Not on file  ?Occupational History  ? Not on file  ?Tobacco Use  ? Smoking status: Every Day  ?  Packs/day: 1.00  ?  Types: Cigarettes  ? Smokeless tobacco: Never  ?Vaping Use  ? Vaping Use: Never used  ?Substance and Sexual Activity  ? Alcohol use: Not Currently  ? Drug use: Not Currently  ?  Types: Marijuana  ? Sexual activity: Yes  ?Other Topics Concern  ? Not on file  ?Social History Narrative  ? Not on file  ? ?Social Determinants of Health  ? ?Financial Resource Strain: Not on  file  ?Food Insecurity: Not on file  ?Transportation Needs: Not on file  ?Physical Activity: Not on file  ?Stress: Not on file  ?Social Connections: Not on file  ?Intimate Partner Violence: Not on file  ? ? ? ?PHYSICAL EXAM ? ?GENERAL EXAM/CONSTITUTIONAL: ?Vitals:  ?Vitals:  ? 12/31/21 1446  ?BP: 136/80  ?Pulse: 81  ?Weight: 217 lb (98.4 kg)  ?Height: 6\' 1"  (1.854 m)  ? ?Body mass index is 28.63 kg/m?. ?Wt Readings from Last 3 Encounters:  ?12/31/21 217 lb (98.4 kg)  ?11/04/21 212 lb 4.8 oz (96.3 kg)  ?10/27/21 204 lb 6.4 oz (92.7 kg)  ? ?Patient is in no distress; well developed, nourished and groomed; neck is supple ? ?CARDIOVASCULAR: ?Examination of carotid arteries is normal; no carotid bruits ?Regular rate and rhythm, no murmurs ?Examination of peripheral vascular system by observation and palpation is normal ? ?EYES: ?Ophthalmoscopic exam of optic discs and posterior segments is normal; no papilledema or hemorrhages ?No results found. ? ?MUSCULOSKELETAL: ?Gait, strength, tone, movements noted in Neurologic exam below ? ?NEUROLOGIC: ?MENTAL STATUS:  ?No flowsheet data found. ?awake, alert, oriented to person, place and time ?recent and remote memory intact ?normal attention and concentration ?language fluent, comprehension intact, naming intact ?fund of knowledge appropriate ? ?CRANIAL NERVE:  ?2nd - no papilledema on fundoscopic exam ?2nd, 3rd, 4th, 6th - RIGHT PUPIL 5 WITH RAPD WITH INCR LIGHT SENS; LEFT PUPIL 4 AND REACTIVE; visual fields full to confrontation, extraocular muscles intact, no nystagmus ?5th - facial sensation symmetric ?7th - facial strength symmetric ?8th - hearing intact ?9th - palate elevates symmetrically, uvula midline ?11th - shoulder shrug symmetric ?12th - tongue protrusion midline ? ?MOTOR:  ?normal bulk and tone, full strength in the BUE, BLE; EXCEPT LEFT ARM TRICEPS 4, LEFT HIP FLEX 4, LEFT FOOT DF 3-4 ?SUSTAINED CLONUS IN LEFT FOOT/ANKLE WITH RAPID DORSIFLEXION ? ?SENSORY:   ?normal and symmetric to light touch; EXCEPT DECR IN LEFT LEG ? ?COORDINATION:  ?finger-nose-finger, fine finger movements SLOW ON LEFT ? ?REFLEXES:  ?deep tendon reflexes BRISK; POSITIVE HOFFMANS IN L > R; INCREASED IN LUE AND LLE ? ?GAIT/STATION:  ?narrow based gait; LIMPING GAIT; LEFT LEG SPASTICITY ? ? ? ? ?DIAGNOSTIC DATA (LABS, IMAGING, TESTING) ?- I reviewed patient records, labs, notes, testing and imaging myself where available. ? ?Lab Results  ?Component Value Date  ? WBC 10.3 10/10/2021  ? HGB 16.1 10/10/2021  ? HCT 47.6 10/10/2021  ? MCV 98.3 10/10/2021  ? PLT 297 10/10/2021  ? ?   ?Component Value Date/Time  ? NA 141 10/27/2021 1659  ? K 4.2 10/27/2021 1659  ? CL 103 10/27/2021 1659  ? CO2 23 10/27/2021 1659  ? GLUCOSE 100 (H) 10/27/2021 1659  ? GLUCOSE 121 (H) 10/10/2021 0412  ?  BUN 9 10/27/2021 1659  ? CREATININE 1.08 10/27/2021 1659  ? CALCIUM 10.1 10/27/2021 1659  ? PROT 7.1 10/10/2021 0412  ? ALBUMIN 4.1 10/10/2021 0412  ? AST 23 10/10/2021 0412  ? ALT 15 10/10/2021 0412  ? ALKPHOS 56 10/10/2021 0412  ? BILITOT 1.0 10/10/2021 0412  ? GFRNONAA >60 10/10/2021 0412  ? ?No results found for: CHOL, HDL, LDLCALC, LDLDIRECT, TRIG, CHOLHDL ?Lab Results  ?Component Value Date  ? HGBA1C 4.8 11/04/2021  ? ?Lab Results  ?Component Value Date  ? KDXIPJAS50 537 10/27/2021  ? ?Lab Results  ?Component Value Date  ? TSH 0.974 10/27/2021  ? ? ? ?10/10/21 MRI thoracic: [I reviewed images myself and agree with interpretation. -VRP]  ?Normal thoracic MRI, with exception of a minimal non-compressive ?disc bulge at T6-7. ? ?10/10/21 MRI lumbar spine [I reviewed images myself and agree with interpretation. -VRP]  ?1. 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 ?Correlation with radiographs is recommended prior to any operative ?intervention. ?  ?2. 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 Involvement of the ?right L5 nerve roots in the lateral recess, and up to mild spinal ?stenosis, but no  significant left side neural impingement. Minimal ?lumbar spine degeneration otherwise. ? ? ?ASSESSMENT AND PLAN ? ?41 y.o. year old male here with left upper and left lower extremity weakness, hyperreflexia on th

## 2022-01-01 ENCOUNTER — Ambulatory Visit: Payer: BC Managed Care – PPO

## 2022-01-01 ENCOUNTER — Ambulatory Visit
Admission: RE | Admit: 2022-01-01 | Discharge: 2022-01-01 | Disposition: A | Discharge status: Home / Self Care | Payer: BC Managed Care – PPO | Source: Ambulatory Visit | Attending: Diagnostic Neuroimaging | Admitting: Diagnostic Neuroimaging

## 2022-01-01 ENCOUNTER — Other Ambulatory Visit: Payer: Self-pay

## 2022-01-01 ENCOUNTER — Other Ambulatory Visit: Payer: Self-pay | Admitting: Diagnostic Neuroimaging

## 2022-01-01 DIAGNOSIS — R531 Weakness: Secondary | ICD-10-CM

## 2022-01-01 DIAGNOSIS — H538 Other visual disturbances: Secondary | ICD-10-CM

## 2022-01-01 DIAGNOSIS — M5000 Cervical disc disorder with myelopathy, unspecified cervical region: Secondary | ICD-10-CM

## 2022-01-01 DIAGNOSIS — R200 Anesthesia of skin: Secondary | ICD-10-CM

## 2022-01-01 MED ORDER — GADOBENATE DIMEGLUMINE 529 MG/ML IV SOLN
20.0000 mL | Freq: Once | INTRAVENOUS | Status: AC | PRN
  Administered 2022-01-01: 20 mL via INTRAVENOUS

## 2022-01-01 NOTE — Progress Notes (Signed)
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 Will setup neurosurgery consult. ? ?Orders Placed This Encounter  ?Procedures  ? Ambulatory referral to Neurosurgery  ? ? ?Penni Bombard, MD [id number], [id number] PM ?Certified in Neurology, Neurophysiology and Neuroimaging ? ?[facility] ?[address], [address] ?[place], [place] [address] ?[phone] ?

## 2022-01-01 NOTE — Telephone Encounter (Signed)
FYI ? ?Patient is having his MRI's right now at [facility] and his US DVT is scheduled for 1 pm today at [facility] imaging.  ?

## 2022-01-04 ENCOUNTER — Telehealth: Payer: Self-pay | Admitting: *Deleted

## 2022-01-04 ENCOUNTER — Ambulatory Visit
Admission: RE | Admit: 2022-01-04 | Discharge: 2022-01-04 | Disposition: A | Discharge status: Home / Self Care | Payer: BC Managed Care – PPO | Source: Ambulatory Visit | Attending: Diagnostic Neuroimaging | Admitting: Diagnostic Neuroimaging

## 2022-01-04 ENCOUNTER — Telehealth: Payer: Self-pay | Admitting: Diagnostic Neuroimaging

## 2022-01-04 DIAGNOSIS — R531 Weakness: Secondary | ICD-10-CM

## 2022-01-04 DIAGNOSIS — M7989 Other specified soft tissue disorders: Secondary | ICD-10-CM

## 2022-01-04 DIAGNOSIS — R200 Anesthesia of skin: Secondary | ICD-10-CM

## 2022-01-04 IMAGING — US US EXTREM LOW VENOUS
1 series · 13 of 24 positions shown · non-contrast
Comparison: None.

CLINICAL DATA: Lower extremity swelling



[Series 1: us extrem low venous · 0.07mm/px · 13 of 76 slices shown]
[im 1/76]
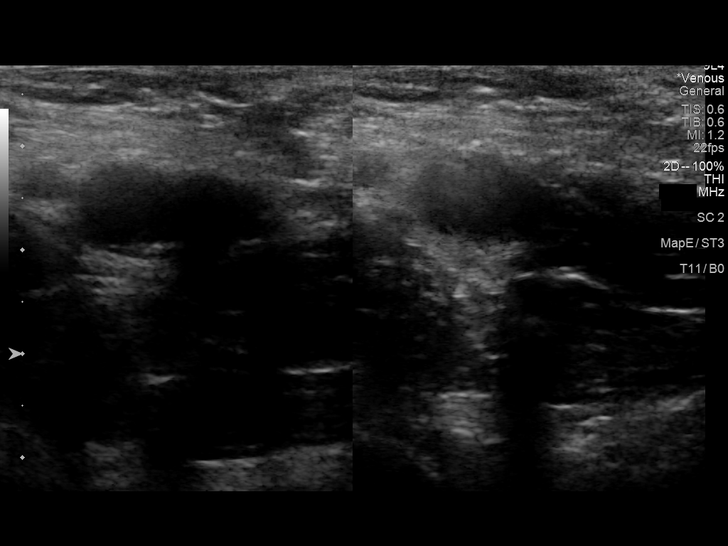
[im 7/76]
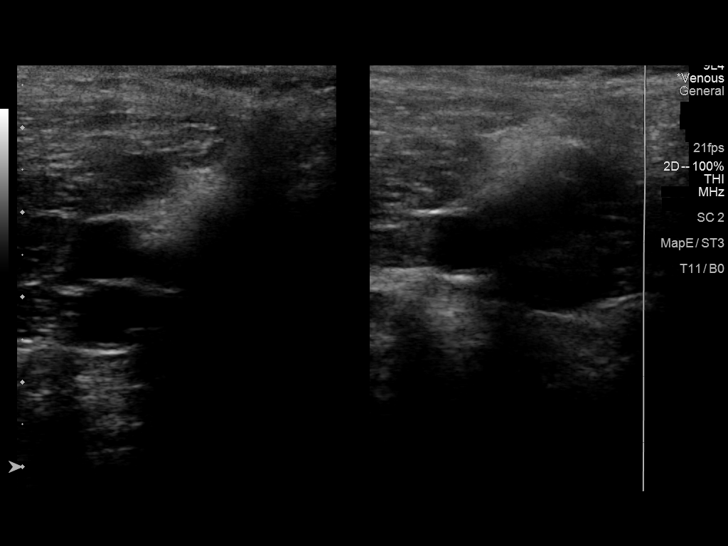
[im 14/76]
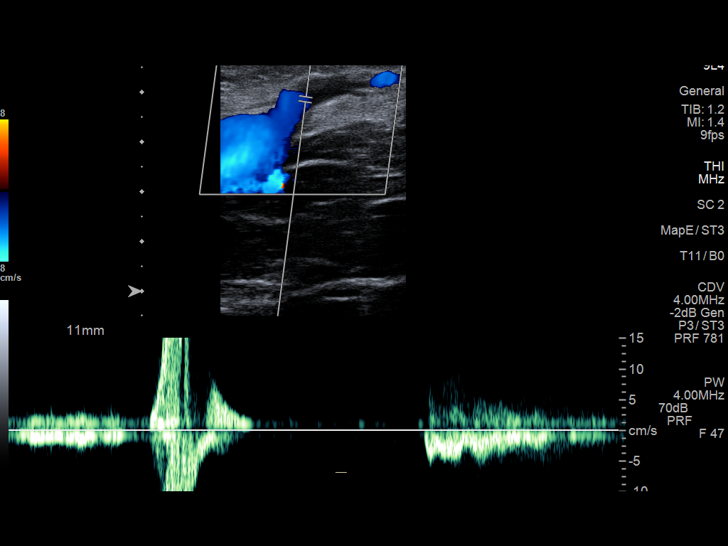
[im 20/76]
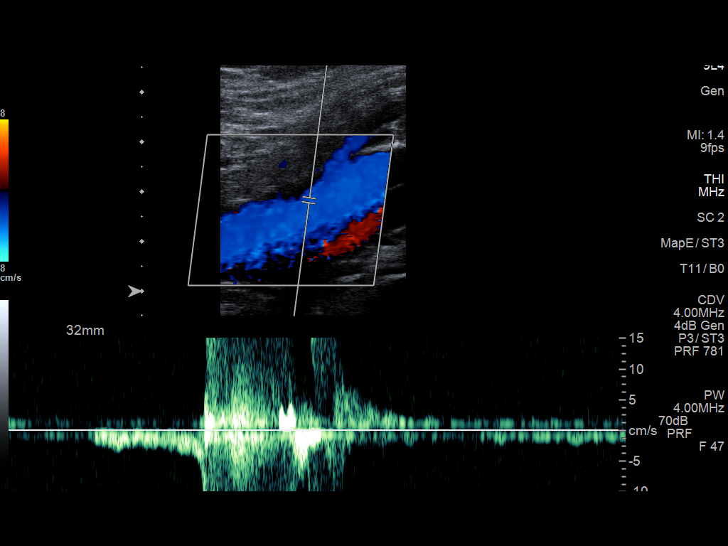
[im 27/76]
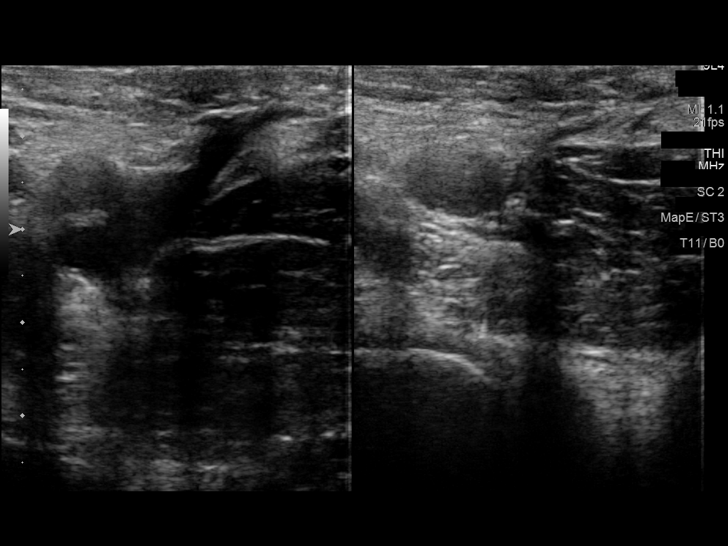
[im 33/76]
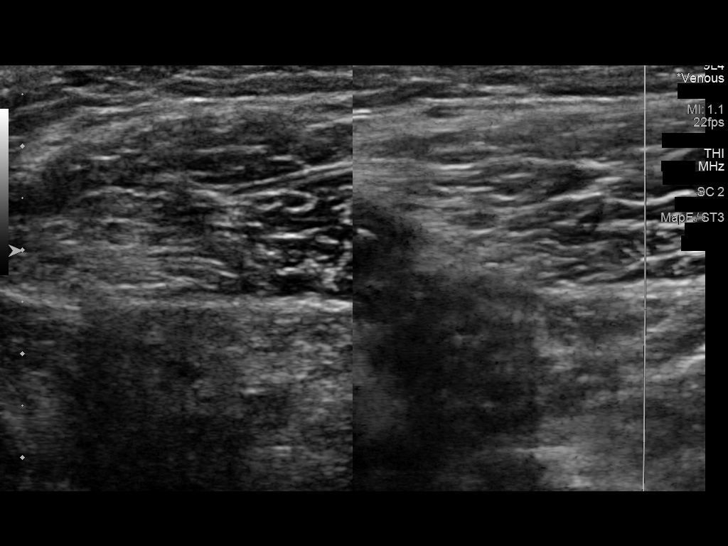
[im 40/76]
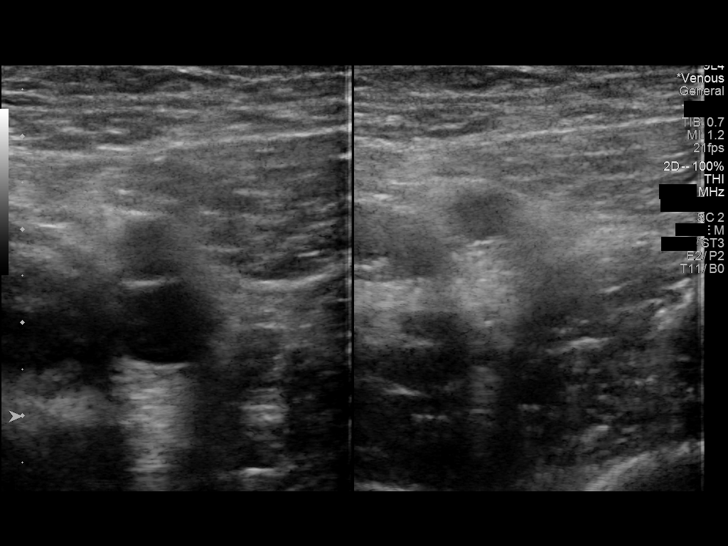
[im 43/76]
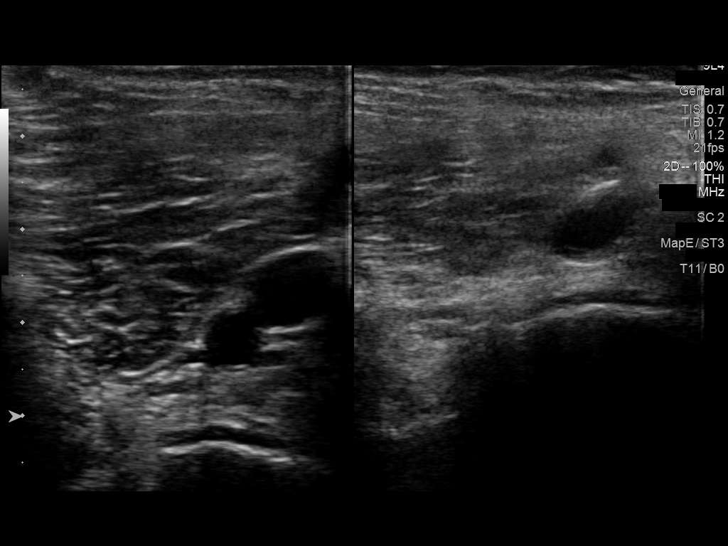
[im 49/76]
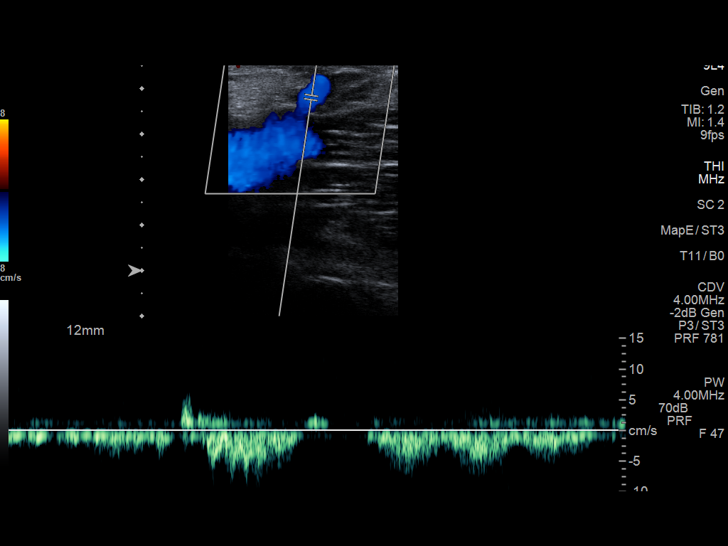
[im 56/76]
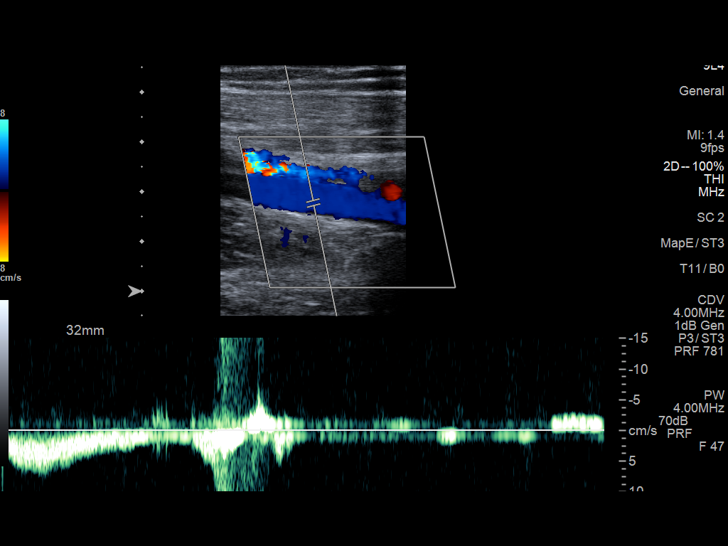
[im 62/76]
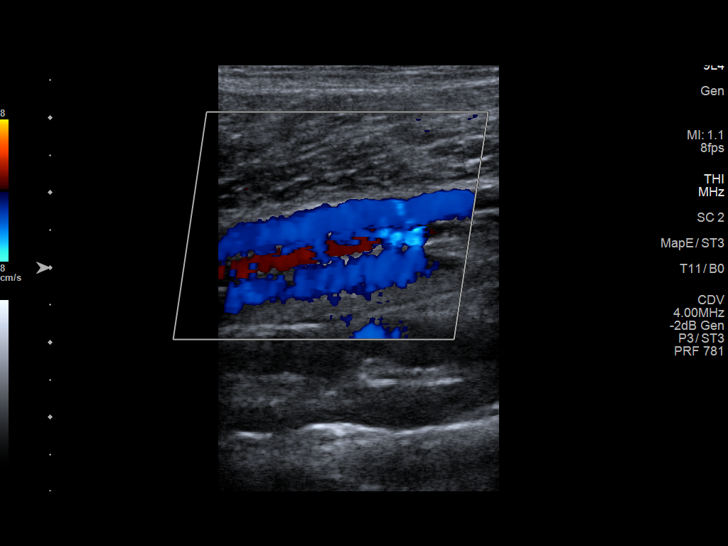
[im 69/76]
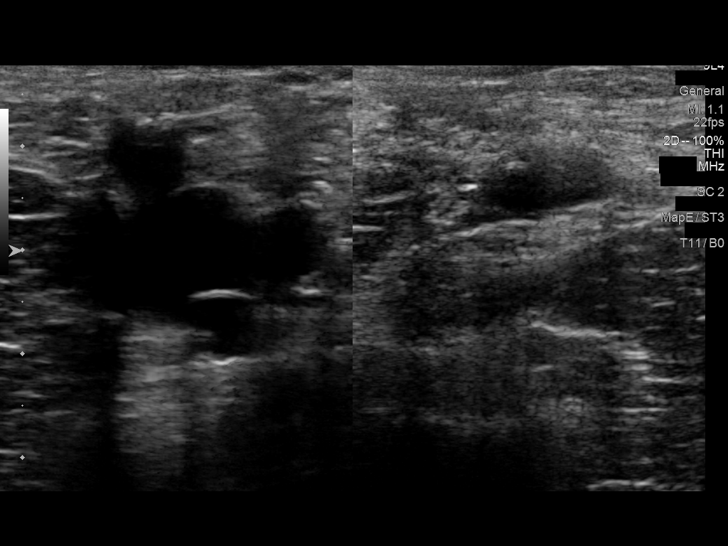
[im 76/76]
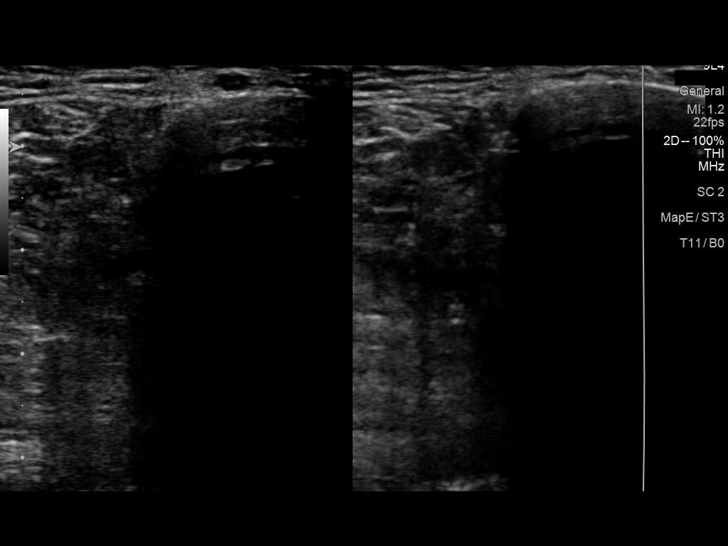

[13 of 24 positions shown; findings below may reference images not displayed]

FINDINGS: RIGHT LOWER EXTREMITY

Common Femoral Vein: No evidence of thrombus. Normal
compressibility, respiratory phasicity and response to augmentation.

Saphenofemoral Junction: No evidence of thrombus. Normal
compressibility and flow on color Doppler imaging.

Profunda Femoral Vein: No evidence of thrombus. Normal
compressibility and flow on color Doppler imaging.

Femoral Vein: No evidence of thrombus. Normal compressibility,
respiratory phasicity and response to augmentation.

Popliteal Vein: No evidence of thrombus. Normal compressibility,
respiratory phasicity and response to augmentation.

Calf Veins: No evidence of thrombus. Normal compressibility and flow
on color Doppler imaging.

Superficial Great Saphenous Vein: No evidence of thrombus. Normal
compressibility.

Venous Reflux:  None.

Other Findings:  None.

LEFT LOWER EXTREMITY

Common Femoral Vein: No evidence of thrombus. Normal
compressibility, respiratory phasicity and response to augmentation.

Saphenofemoral Junction: No evidence of thrombus. Normal
compressibility and flow on color Doppler imaging.

Profunda Femoral Vein: No evidence of thrombus. Normal
compressibility and flow on color Doppler imaging.

Femoral Vein: No evidence of thrombus. Normal compressibility,
respiratory phasicity and response to augmentation.

Popliteal Vein: No evidence of thrombus. Normal compressibility,
respiratory phasicity and response to augmentation.

Calf Veins: No evidence of thrombus. Normal compressibility and flow
on color Doppler imaging.

Superficial Great Saphenous Vein: No evidence of thrombus. Normal
compressibility.

Venous Reflux:  None.

Other Findings:  None.
IMPRESSION: No evidence of deep venous thrombosis in either lower extremity.

## 2022-01-04 NOTE — Telephone Encounter (Signed)
Called patient to give MRI results and discussed this message as well.  ?He stated PCP currently had him out of work from 11/09/21 to 12/20/21. His work sent a continuation form to PCP who advised he ask neurologist for estimated return to work date. Patient stated he understands a definite date cannot be given but his job needs estimate. This is only way he can continue to receive benefits while out. I advised will discuss with MD and let him know. Patient verbalized understanding, appreciation. ? ?

## 2022-01-04 NOTE — Telephone Encounter (Signed)
Called patient and informed him MRI brain is unremarkable. 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 He needs neurosurgery consult. Dr Marjory Lies sent urgent referral to [facility], and he should get a call in next few days.  He asked if he needs surgery, explained that the cord compression must be relieved, will not improve on it's own. Neurosurgeon can best advise him. Patient verbalized understanding, appreciation. ? ? ? ?

## 2022-01-04 NOTE — Telephone Encounter (Signed)
Sent referral to [facility] over on proficient as urgent. Ph # [phone] ?

## 2022-01-04 NOTE — Telephone Encounter (Signed)
Called patient and informed him Dr Marjory Lies stated he can write him out of work for next 1 month until he sees neurosurgery and gets treatment.  Patient would like letter e mailed to him. Patient verbalized understanding, appreciation. ?Letter on MD desk for review, signature.  ?

## 2022-01-04 NOTE — Telephone Encounter (Signed)
Patient called his PCP need to know when will he be able to return to work. Please call patient [phone] ?

## 2022-01-05 ENCOUNTER — Ambulatory Visit: Payer: BC Managed Care – PPO | Admitting: Diagnostic Neuroimaging

## 2022-01-05 ENCOUNTER — Encounter: Payer: Self-pay | Admitting: *Deleted

## 2022-01-05 NOTE — Telephone Encounter (Signed)
Letter signed, e mailed to patient. Sent my chart to inform patient. ?

## 2022-01-13 ENCOUNTER — Ambulatory Visit: Payer: BC Managed Care – PPO | Admitting: Diagnostic Neuroimaging

## 2022-01-15 ENCOUNTER — Encounter: Payer: Self-pay | Admitting: Diagnostic Neuroimaging

## 2022-01-21 ENCOUNTER — Emergency Department (HOSPITAL_COMMUNITY): Payer: BC Managed Care – PPO

## 2022-01-21 ENCOUNTER — Emergency Department (HOSPITAL_COMMUNITY)
Admission: EM | Admit: 2022-01-21 | Discharge: 2022-01-21 | Disposition: A | Discharge status: Home / Self Care | Payer: BC Managed Care – PPO | Attending: Emergency Medicine | Admitting: Emergency Medicine

## 2022-01-21 ENCOUNTER — Other Ambulatory Visit: Payer: Self-pay

## 2022-01-21 DIAGNOSIS — W228XXA Striking against or struck by other objects, initial encounter: Secondary | ICD-10-CM | POA: Insufficient documentation

## 2022-01-21 DIAGNOSIS — M545 Low back pain, unspecified: Secondary | ICD-10-CM | POA: Insufficient documentation

## 2022-01-21 IMAGING — CT CT L SPINE W/O CM
3 series · 13 of 33 positions shown, 16 images · non-contrast
Comparison: None.

CLINICAL DATA: Low back pain, trauma



[Series 3: l-spine 2.0 st · axial · 0.31mm/px · z∈[+1081,+1271]mm · 5 of 137 slices shown, 7 images]
[im 21/137  soft-tissue]
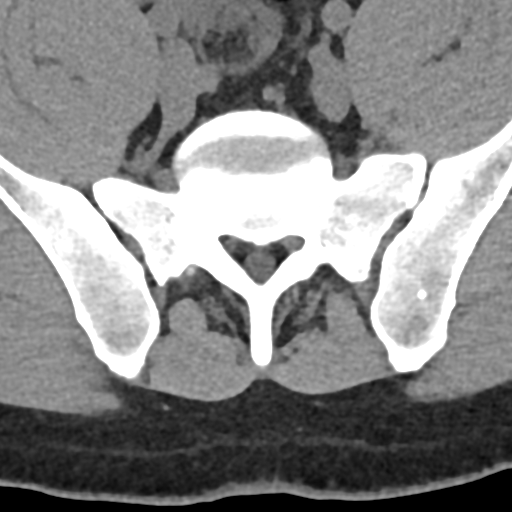
[im 21/137  bone]
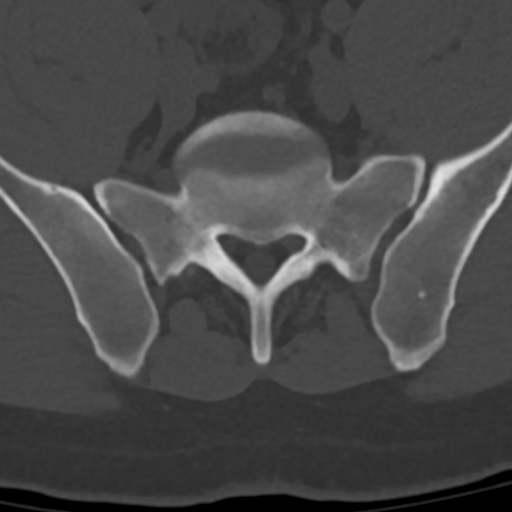
[im 42/137  bone]
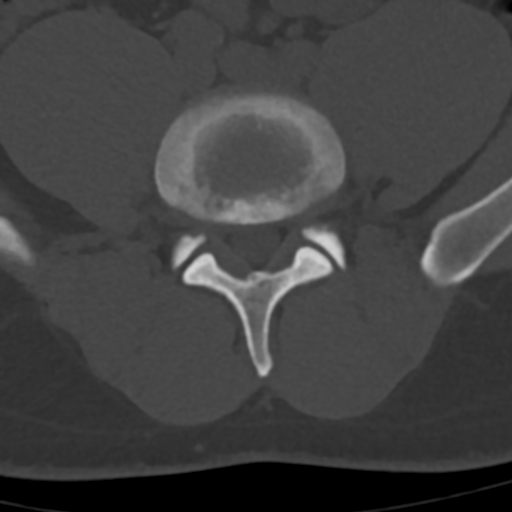
[im 74/137  bone]
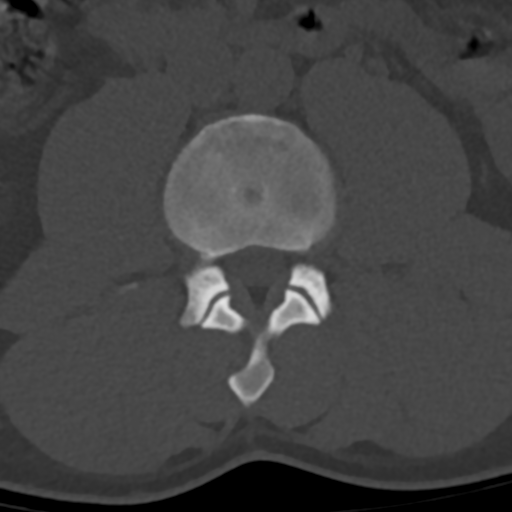
[im 95/137  bone]
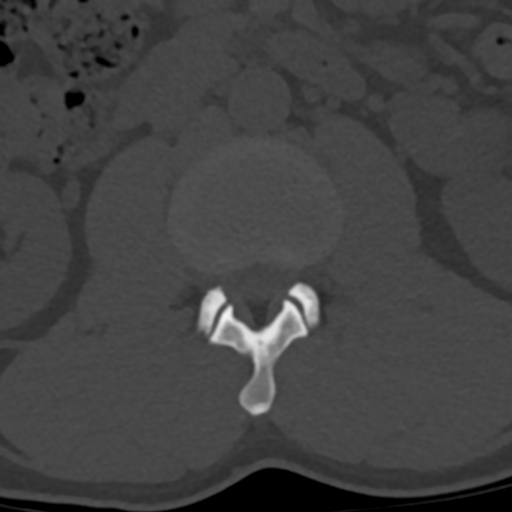
[im 116/137  soft-tissue]
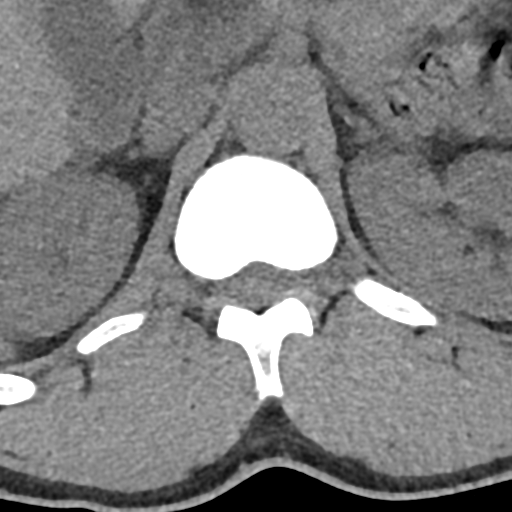
[im 116/137  bone]
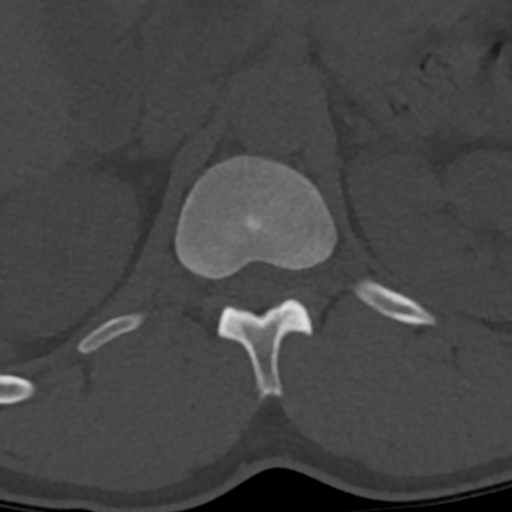

[Series 9: l-spine 2.0 cor · coronal · 0.40mm/px · 3 of 60 slices shown]
[im 12/60  bone]
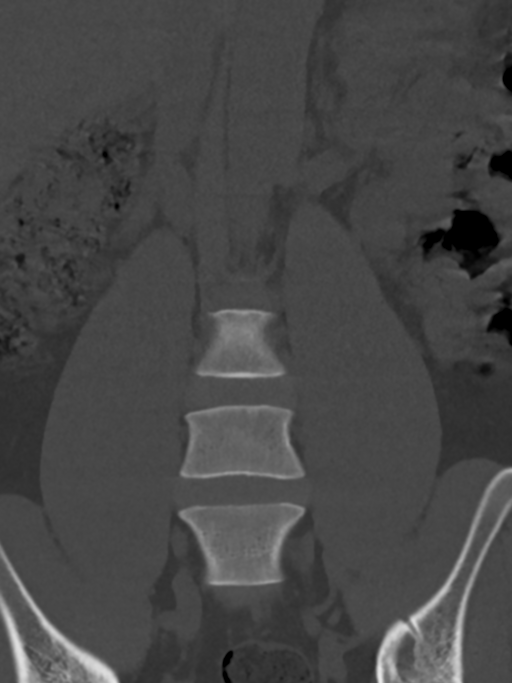
[im 24/60  bone]
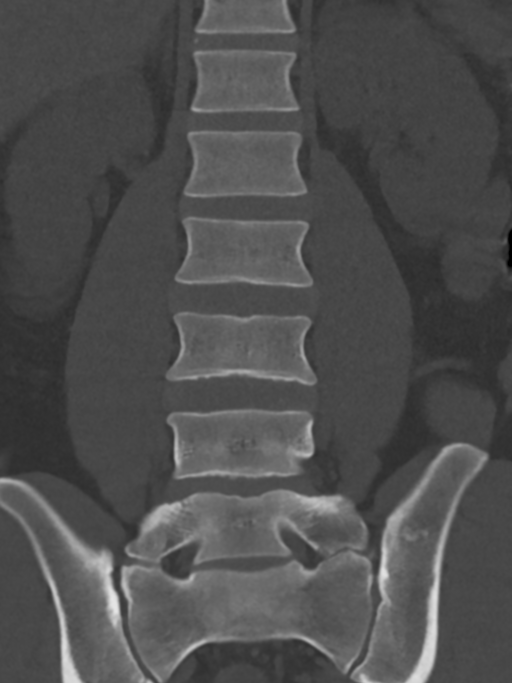
[im 36/60  bone]
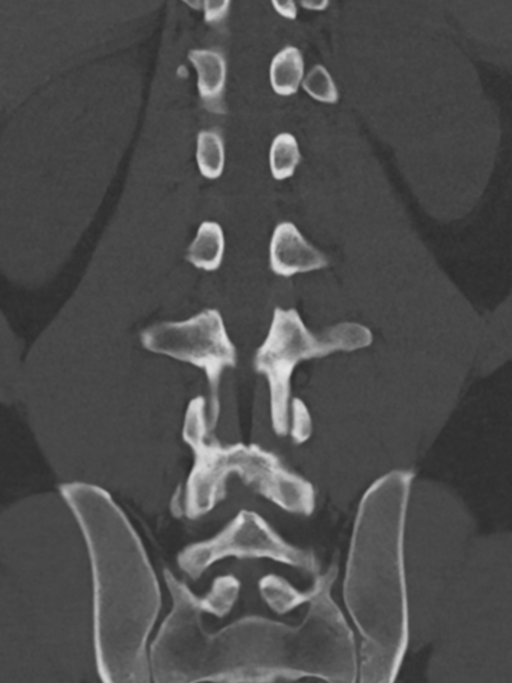

[Series 10: l-spine 2.0 sag · sagittal · 0.41mm/px · 5 of 68 slices shown, 6 images]
[im 23/68  bone]
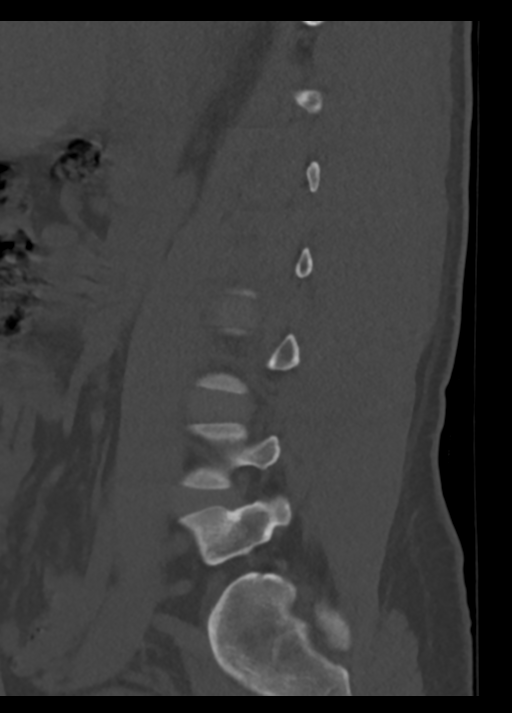
[im 28/68  bone]
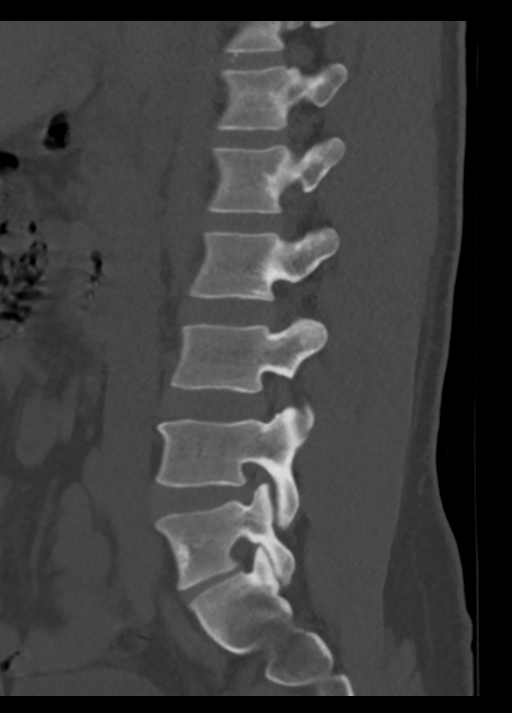
[im 34/68  soft-tissue]
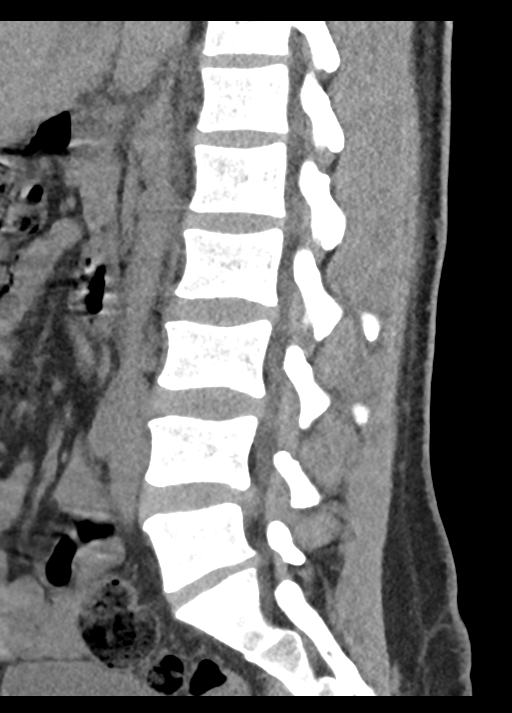
[im 34/68  bone]
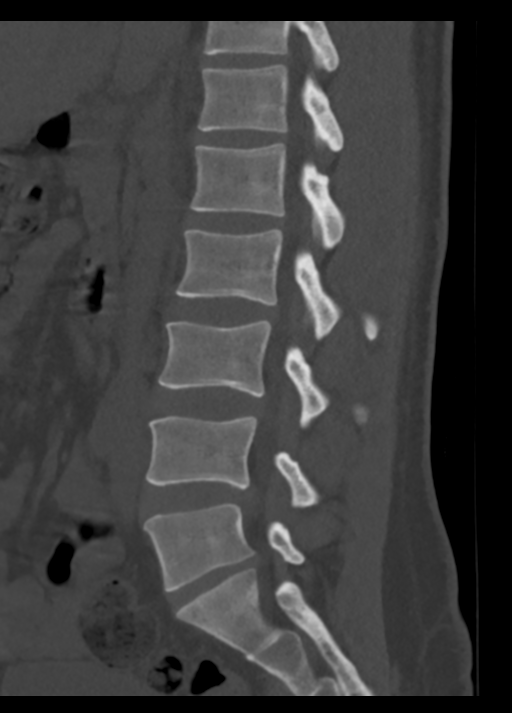
[im 40/68  bone]
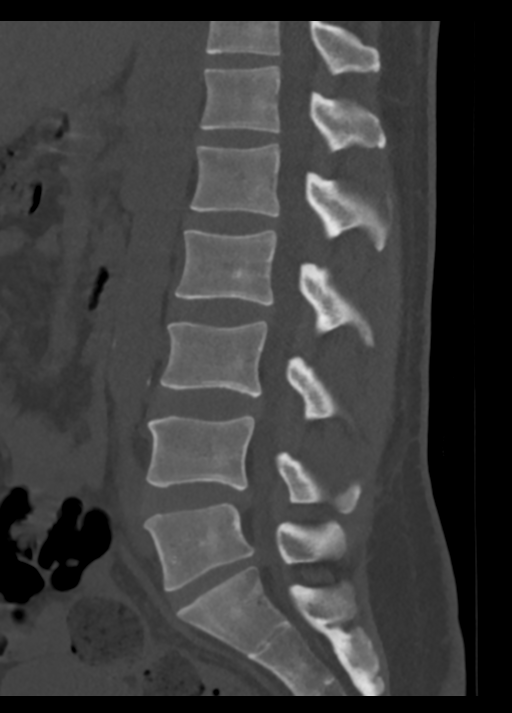
[im 45/68  bone]
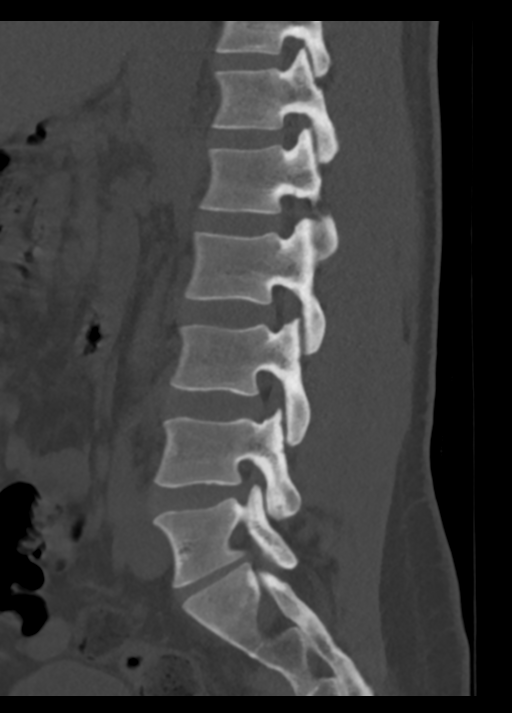

[13 of 33 positions shown; findings below may reference images not displayed]

FINDINGS: Segmentation: 5 lumbar type vertebrae.

Alignment: Straightening of the lumbar spine. Mild retrolisthesis of
L4.

Vertebrae: No acute fracture or focal pathologic process.

Paraspinal and other soft tissues: Negative.

Disc levels:

T12-L1: Significant disc bulge, spinal canal or neural foraminal
stenosis.

L1-L2: No significant disc bulge spinal canal or neural foraminal
stenosis.

L2-L3: Mild disc bulge without significant spinal canal or neural
foraminal stenosis.

L3-L4: Disc bulge and ligamentum flavum hypertrophy with narrowing
of spinal canal. Mild bilateral lateral recess stenosis. No
significant neural foraminal stenosis.

L4-L5: Disc bulge and ligamentum flavum hypertrophy with moderate
narrowing of spinal canal. Moderate lateral recess stenosis. No
significant neural foraminal stenosis.

L5-S1: Disc osteophyte complex with narrowing of spinal canal. No
significant neural foraminal stenosis.
IMPRESSION: 1.  No evidence of fracture or subluxation.

2.  Mild degenerative disc disease at L3-L4, L4-L5 and L5-S1.

## 2022-01-21 MED ORDER — OXYCODONE-ACETAMINOPHEN 5-325 MG PO TABS
1.0000 | ORAL_TABLET | Freq: Once | ORAL | Status: AC
  Administered 2022-01-21: 1 via ORAL
  Filled 2022-01-21: qty 1

## 2022-01-21 MED ORDER — LIDOCAINE 5 % EX PTCH: 1.0000 | MEDICATED_PATCH | CUTANEOUS | 0 refills | Status: AC

## 2022-01-21 NOTE — ED Triage Notes (Signed)
Pt. Stated, I had neck surgery last Thursday and this morning I hit my knee and my left leg started shaking uncontrollable. ?

## 2022-01-21 NOTE — Discharge Instructions (Addendum)
Your CT scan is without any fractures.  I have printed a copy for your records.  Please follow-up with your spine surgeon at your scheduled appointment on the 17th.  You may always return to the ER if any new or concerning symptom specifically any bowel or bladder incontinence ?

## 2022-01-21 NOTE — ED Provider Notes (Signed)
?[facility] EMERGENCY DEPARTMENT ?Provider Note ? ? ?CSN: [id number] ?Arrival date & time: 01/21/22  0806 ? ?  ? ?History ? ?Chief Complaint  ?Patient presents with  ? Back Pain  ? skaing left leg  ? ? ?Ricardo Wright is a 41 y.o. male. ? ? ?Back Pain ? ?Patient is a 40 year old male with past medical history significant for s/p 1 week ago discectomy via anterior approach and fusion of cervical spine ? ?Patient states that he had some improvement in his discomfort and in his jerking of his left foot after his surgery however he states that this morning he bumped his shin against a piece of wood and twisted at his low back and felt a pop and had some worsening of his left leg shaking. ? ? ? ?  ? ? ?Home Medications ?Prior to Admission medications   ?Medication Sig Start Date End Date Taking? Authorizing Provider  ?acetaminophen (TYLENOL) 500 MG tablet Take 1,000 mg by mouth every 6 (six) hours as needed for mild pain.   Yes [provider]  ?cyclobenzaprine (FLEXERIL) 5 MG tablet Take 5 mg by mouth every 8 (eight) hours as needed for muscle spasms. 01/16/22  Yes [provider]  ?lidocaine (LIDODERM) 5 % Place 1 patch onto the skin daily. Remove & Discard patch within 12 hours or as directed by MD 01/21/22  Yes Gailen Shelter, PA  ?OVER THE COUNTER MEDICATION Apply 1 application. topically 2 (two) times daily as needed (pain). Aleve Pain Relief roll on   Yes [provider]  ?OVER THE COUNTER MEDICATION Apply 1 application. topically 2 (two) times daily as needed (pain). AleveX Pain Relieving Spray   Yes [provider]  ?oxyCODONE-acetaminophen (PERCOCET/ROXICET) 5-325 MG tablet Take 1 tablet by mouth every 6 (six) hours as needed for pain. 01/15/22  Yes [provider]  ?sodium chloride (OCEAN) 0.65 % SOLN nasal spray Place 1 spray into both nostrils daily as needed for congestion.   Yes [provider]  ?   ? ?Allergies    ?Patient has no known  allergies.   ? ?Review of Systems   ?Review of Systems  ?Musculoskeletal:  Positive for back pain.  ? ?Physical Exam ?Updated Vital Signs ?BP 135/74 (BP Location: Right Arm)   Pulse 75   Temp 98 ?F (36.7 ?C) (Oral)   Resp 15   Ht 6\' 2"  (1.88 m)   Wt 95.3 kg   SpO2 96%   BMI 26.96 kg/m?  ?Physical Exam ?Vitals and nursing note reviewed.  ?Constitutional:   ?   General: He is not in acute distress. ?HENT:  ?   Head: Normocephalic and atraumatic.  ?   Nose: Nose normal.  ?Eyes:  ?   General: No scleral icterus. ?Cardiovascular:  ?   Rate and Rhythm: Normal rate and regular rhythm.  ?   Pulses: Normal pulses.  ?   Heart sounds: Normal heart sounds.  ?Pulmonary:  ?   Effort: Pulmonary effort is normal. No respiratory distress.  ?   Breath sounds: No wheezing.  ?Abdominal:  ?   Palpations: Abdomen is soft.  ?   Tenderness: There is no abdominal tenderness.  ?Musculoskeletal:  ?   Cervical back: Normal range of motion.  ?   Right lower leg: No edema.  ?   Left lower leg: No edema.  ?   Comments: TTP diffusely to the L spine region. No focal midline L spine TTP.   ?Skin: ?  General: Skin is warm and dry.  ?   Capillary Refill: Capillary refill takes less than 2 seconds.  ?Neurological:  ?   Mental Status: He is alert. Mental status is at baseline.  ?   Comments: Bilateral patellar reflexes present.  Able to lift both legs independently off the bed.  Flexion extension at knee.  ?Psychiatric:     ?   Mood and Affect: Mood normal.     ?   Behavior: Behavior normal.  ? ? ?ED Results / Procedures / Treatments   ?Labs ?(all labs ordered are listed, but only abnormal results are displayed) ?Labs Reviewed - No data to display ? ?EKG ?None ? ?Radiology ?CT Lumbar Spine Wo Contrast ? ?Result Date: 01/21/2022 ?CLINICAL DATA:  Low back pain, trauma EXAM: CT LUMBAR SPINE WITHOUT CONTRAST TECHNIQUE: Multidetector CT imaging of the lumbar spine was performed without intravenous contrast administration. Multiplanar CT image  reconstructions were also generated. RADIATION DOSE REDUCTION: This exam was performed according to the departmental dose-optimization program which includes automated exposure control, adjustment of the mA and/or kV according to patient size and/or use of iterative reconstruction technique. COMPARISON:  None. FINDINGS: Segmentation: 5 lumbar type vertebrae. Alignment: Straightening of the lumbar spine. Mild retrolisthesis of L4. Vertebrae: No acute fracture or focal pathologic process. Paraspinal and other soft tissues: Negative. Disc levels: T12-L1: Significant disc bulge, spinal canal or neural foraminal stenosis. L1-L2: No significant disc bulge spinal canal or neural foraminal stenosis. L2-L3: Mild disc bulge without significant spinal canal or neural foraminal stenosis. L3-L4: Disc bulge and ligamentum flavum hypertrophy with narrowing of spinal canal. Mild bilateral lateral recess stenosis. No significant neural foraminal stenosis. L4-L5: Disc bulge and ligamentum flavum hypertrophy with moderate narrowing of spinal canal. Moderate lateral recess stenosis. No significant neural foraminal stenosis. L5-S1: Disc osteophyte complex with narrowing of spinal canal. No significant neural foraminal stenosis. IMPRESSION: 1.  No evidence of fracture or subluxation. 2.  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 Electronically Signed   By: Larose Hires D.O.   On: 01/21/2022 09:50   ? ?Procedures ?Procedures  ? ? ?Medications Ordered in ED ?Medications  ?oxyCODONE-acetaminophen (PERCOCET/ROXICET) 5-325 MG per tablet 1 tablet (1 tablet Oral Given 01/21/22 0957)  ? ? ?ED Course/ Medical Decision Making/ A&P ?Clinical Course as of 01/22/22 1628  ?Thu Jan 21, 2022  ?1497 Discectomy 1 week ago [WF]  ?  ?Clinical Course User Index ?[WF] Gailen Shelter, [place]  ? ?                        ?Medical Decision Making ?Amount and/or Complexity of Data Reviewed ?Radiology: ordered. ? ?Risk ?Prescription drug  management. ? ? ?Pt is a 41 year old male - recent surgery described in HPI and after moving strangely he had a pop in his loow back and now some jerking in his leg.  ? ?PE relatively unremarkable. Pain in mildline low back so will obtain CT (unlikely that pt has a compression fx given mechanism) and will discuss with Neurosurg given his recent surgery.  ? ?Discussed with JOSHUA L. MCDANIEL who works with Dr. Maisie Fus who did this patient's surgery. 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 He is to keep his appointment on the 17th of this month and J Julien Girt will alert Dr. Maisie Fus about this ER visit.  ? ? ?  I personally viewed images of pt's CT scan and reviewed the rad read which I agree with.  ? ?Pt able to walk and ambulates without pain. Will DC home with his outpatient scheduled appointment. Given strict return precautions for cauda equina or infectious symptoms.  ? ? ?Final Clinical Impression(s) / ED Diagnoses ?Final diagnoses:  ?Acute low back pain without sciatica, unspecified back pain laterality  ? ? ?Rx / DC Orders ?ED Discharge Orders   ? ?      Ordered  ?  lidocaine (LIDODERM) 5 %  Every 24 hours       ? 01/21/22 1021  ? ?  ?  ? ?  ? ? ?  ?Gailen ShelterFondaw, Carrissa Taitano S, [place] ?01/22/22 1638 ? ?  ?Gwyneth SproutPlunkett, [place], MD ?01/23/22 [phone] ? ?

## 2022-01-28 ENCOUNTER — Ambulatory Visit: Payer: BC Managed Care – PPO | Admitting: Neurology

## 2022-01-29 ENCOUNTER — Ambulatory Visit: Payer: BC Managed Care – PPO | Admitting: Diagnostic Neuroimaging

## 2022-12-29 ENCOUNTER — Telehealth (HOSPITAL_BASED_OUTPATIENT_CLINIC_OR_DEPARTMENT_OTHER): Payer: Self-pay

## 2022-12-29 ENCOUNTER — Other Ambulatory Visit (HOSPITAL_BASED_OUTPATIENT_CLINIC_OR_DEPARTMENT_OTHER): Payer: Self-pay | Admitting: Neurosurgery

## 2022-12-29 DIAGNOSIS — M4712 Other spondylosis with myelopathy, cervical region: Secondary | ICD-10-CM

## 2023-01-26 ENCOUNTER — Ambulatory Visit (HOSPITAL_BASED_OUTPATIENT_CLINIC_OR_DEPARTMENT_OTHER): Admission: RE | Admit: 2023-01-26 | Payer: BC Managed Care – PPO | Source: Ambulatory Visit

## 2023-01-28 ENCOUNTER — Ambulatory Visit (HOSPITAL_BASED_OUTPATIENT_CLINIC_OR_DEPARTMENT_OTHER): Admission: RE | Admit: 2023-01-28 | Payer: BC Managed Care – PPO | Source: Ambulatory Visit

## 2023-01-30 ENCOUNTER — Ambulatory Visit (HOSPITAL_BASED_OUTPATIENT_CLINIC_OR_DEPARTMENT_OTHER)
Admission: RE | Admit: 2023-01-30 | Discharge: 2023-01-30 | Disposition: A | Discharge status: Home / Self Care | Payer: BC Managed Care – PPO | Source: Ambulatory Visit | Attending: Neurosurgery | Admitting: Neurosurgery

## 2023-01-30 DIAGNOSIS — M4712 Other spondylosis with myelopathy, cervical region: Secondary | ICD-10-CM | POA: Insufficient documentation

## 2023-03-14 ENCOUNTER — Encounter: Payer: Self-pay | Admitting: *Deleted
